# Patient Record
Sex: Female | Born: 1980 | Hispanic: No | Marital: Single | Smoking: Former smoker
Health system: Southern US, Community
[De-identification: ages and names within clinical notes are randomized; demographics above are authoritative.]

## PROBLEM LIST (undated history)

## (undated) ENCOUNTER — Inpatient Hospital Stay: Payer: Self-pay

## (undated) DIAGNOSIS — O09529 Supervision of elderly multigravida, unspecified trimester: Secondary | ICD-10-CM

## (undated) DIAGNOSIS — Z9889 Other specified postprocedural states: Secondary | ICD-10-CM

## (undated) DIAGNOSIS — R011 Cardiac murmur, unspecified: Secondary | ICD-10-CM

## (undated) DIAGNOSIS — R42 Dizziness and giddiness: Secondary | ICD-10-CM

## (undated) DIAGNOSIS — I10 Essential (primary) hypertension: Secondary | ICD-10-CM

## (undated) DIAGNOSIS — F53 Postpartum depression: Secondary | ICD-10-CM

## (undated) DIAGNOSIS — J189 Pneumonia, unspecified organism: Secondary | ICD-10-CM

## (undated) DIAGNOSIS — O99345 Other mental disorders complicating the puerperium: Secondary | ICD-10-CM

## (undated) DIAGNOSIS — M5136 Other intervertebral disc degeneration, lumbar region: Secondary | ICD-10-CM

## (undated) DIAGNOSIS — M51369 Other intervertebral disc degeneration, lumbar region without mention of lumbar back pain or lower extremity pain: Secondary | ICD-10-CM

## (undated) DIAGNOSIS — R112 Nausea with vomiting, unspecified: Secondary | ICD-10-CM

## (undated) HISTORY — PX: TONSILLECTOMY: SUR1361

## (undated) HISTORY — PX: WISDOM TOOTH EXTRACTION: SHX21

## (undated) HISTORY — PX: EYE SURGERY: SHX253

## (undated) HISTORY — PX: BACK SURGERY: SHX140

## (undated) HISTORY — PX: DILATION AND CURETTAGE OF UTERUS: SHX78

## (undated) HISTORY — PX: COLONOSCOPY: SHX174

---
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
## 2014-07-31 DIAGNOSIS — O09529 Supervision of elderly multigravida, unspecified trimester: Secondary | ICD-10-CM

## 2014-07-31 HISTORY — DX: Supervision of elderly multigravida, unspecified trimester: O09.529

## 2014-07-31 NOTE — L&D Delivery Note (Signed)
Delivery Note  34yo [id number] at 40+6wks At 9:18 AM a viable and healthy female "[place]" was delivered via Vaginal, Spontaneous Delivery (Presentation: Right Occiput Anterior).  APGAR: 9, 9; weight still pending skin to skin with mom  Placenta status: Intact, Spontaneous.  Cord: 3 vessels with no complications:   Anesthesia: Epidural  Episiotomy: None Lacerations: Labial Suture Repair: none Est. Blood Loss (mL): 300  Mom to postpartum.  Baby to Couplet care / Skin to Skin.  IOL with cervical ripening for gHTN with no s/s of severe preeclampsia, transitioned to pitocin, delivered spontaneously with 3 contractions. Vigorous and healthy baby promptly crying to mom's chest.  Johnie Stadel 06/29/2015, 9:33 AM

## 2014-12-07 ENCOUNTER — Other Ambulatory Visit: Payer: Self-pay | Admitting: Family Medicine

## 2014-12-07 DIAGNOSIS — F329 Major depressive disorder, single episode, unspecified: Secondary | ICD-10-CM | POA: Insufficient documentation

## 2014-12-07 DIAGNOSIS — Z3480 Encounter for supervision of other normal pregnancy, unspecified trimester: Secondary | ICD-10-CM

## 2014-12-07 DIAGNOSIS — Z3481 Encounter for supervision of other normal pregnancy, first trimester: Secondary | ICD-10-CM

## 2014-12-09 LAB — OB RESULTS CONSOLE ANTIBODY SCREEN: Antibody Screen: NEGATIVE

## 2014-12-09 LAB — OB RESULTS CONSOLE RPR: RPR: NONREACTIVE

## 2014-12-09 LAB — OB RESULTS CONSOLE HEPATITIS B SURFACE ANTIGEN: HEP B S AG: NEGATIVE

## 2014-12-09 LAB — OB RESULTS CONSOLE ABO/RH: RH TYPE: POSITIVE

## 2014-12-09 LAB — OB RESULTS CONSOLE HIV ANTIBODY (ROUTINE TESTING): HIV: NONREACTIVE

## 2014-12-09 LAB — OB RESULTS CONSOLE VARICELLA ZOSTER ANTIBODY, IGG: Varicella: IMMUNE

## 2014-12-21 ENCOUNTER — Other Ambulatory Visit: Payer: Self-pay | Admitting: Maternal & Fetal Medicine

## 2014-12-21 ENCOUNTER — Inpatient Hospital Stay
Admission: RE | Admit: 2014-12-21 | Discharge: 2014-12-21 | Disposition: A | Discharge status: Home / Self Care | Payer: Self-pay | Source: Ambulatory Visit

## 2014-12-21 ENCOUNTER — Ambulatory Visit
Admission: RE | Admit: 2014-12-21 | Discharge: 2014-12-21 | Disposition: A | Discharge status: Home / Self Care | Payer: Medicaid Other | Source: Ambulatory Visit | Attending: Maternal & Fetal Medicine | Admitting: Maternal & Fetal Medicine

## 2014-12-21 VITALS — BP 125/81 | HR 79 | Temp 98.2°F | Ht 72.0 in | Wt 209.0 lb

## 2014-12-21 DIAGNOSIS — O09529 Supervision of elderly multigravida, unspecified trimester: Secondary | ICD-10-CM | POA: Insufficient documentation

## 2014-12-21 DIAGNOSIS — O09522 Supervision of elderly multigravida, second trimester: Secondary | ICD-10-CM | POA: Insufficient documentation

## 2014-12-21 DIAGNOSIS — Z3A13 13 weeks gestation of pregnancy: Secondary | ICD-10-CM | POA: Insufficient documentation

## 2014-12-21 DIAGNOSIS — O09521 Supervision of elderly multigravida, first trimester: Secondary | ICD-10-CM

## 2014-12-21 DIAGNOSIS — Z3481 Encounter for supervision of other normal pregnancy, first trimester: Secondary | ICD-10-CM

## 2014-12-21 DIAGNOSIS — Z8279 Family history of other congenital malformations, deformations and chromosomal abnormalities: Secondary | ICD-10-CM

## 2014-12-21 LAB — US OB COMP LESS 14 WKS

## 2014-12-21 NOTE — Progress Notes (Signed)
Referring Provider:   [facility] Length of Consultation: 50 minutes  Jeanne Hamilton was referred to [facility] for genetic counseling because of advanced maternal age and a previous pregnancy loss diagnosed with trisomy 62.  The patient will be 34 years old at the time of delivery.  This note summarizes the information we discussed.    We first obtained a detailed family history and pregnancy history.  This is the sixth pregnancy for this couple, the fourth with this partner.  She has one 57 year old son from a previous relationship who is in good health.  Omon, the father of the baby, has two healthy sons as well.  The couple together has a three year old daughter who is also in good health.  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  We have requested medical records on that testing to confirm the type of chromosome difference.  She also mentioned that there were abnormal genetic test results in her pregnancy with their daughter, but that she was healthy at birth.  We have also requested those records. The remainder of the family history is unremarkable for birth defects, mental retardation, recurrent pregnancy loss or known chromosome abnormalities.  Jeanne Hamilton reported mental health conditions in several family members.  We discussed that mental health disorders are known to have strong genetic factors in many families, but that no specific genes are known at this time to determine who may be at risk.    Jeanne Hamilton reported no complications in this pregnancy and no exposures that would be expected to increase the risk for birth defects.  She was taking medications for back pain prior to learning that she was pregnant, but stopped them all as soon as she learned she was pregnant, reportedly at 4-[redacted] weeks gestation.  Exposures prior to 4-[redacted] weeks gestation are  considered to be in the all or none period, where they are either expected to result in early pregnancy loss or not cause harmful effects because the embryo has not yet implanted.  Chromosomes and examples of chromosome problems were reviewed.  Humans typically have 46 chromosomes in each cell, with half passed through each sperm and egg.  Any change in the number or structure of chromosomes can increase the risk of problems in the physical and mental development of a pregnancy. Chromosome differences are seen in approximately 50% of all first trimester spontaneous pregnancy losses.  The presence of an extra chromosome number 22 in each cell of an individual, known as trisomy 10, is not an uncommon chromosome difference.  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  If this is the case, then we would expect that the extra chromosome occurred sporadically as an error in the formation of either the egg or sperm for that pregnancy.  Rarely, the extra chromosome may be present attached to another chromosome, called a translocation, which may be inherited from a carrier parent.  This is much less likely, but we have requested medical records from the doctor in [place] where Jeanne Hamilton was seen during that pregnancy to confirm the karyotype.  If it was the usual trisomy, then the recurrence chance for any chromosome condition in a future pregnancy is estimated to be 1%, or the maternal age risk, which is similar at age 76 years.    We then explained  that the chance of a chromosome abnormality increases with maternal age.  Based upon age of the patient, the chance of any chromosome abnormality was 1 in 40. The chance of Down syndrome, the most common chromosome problem associated with maternal age, was 1 in 65.  The risk of chromosome problems is in addition to the 3% general population risk for birth defects and mental retardation.  The greatest chance, of course, is  that the baby would be born in good health.  We discussed the following prenatal screening and testing options for this pregnancy:  First trimester screening, which can include nuchal translucency ultrasound screen and/or first trimester maternal serum marker screening.  The nuchal translucency has approximately an 80% detection rate for Down syndrome and can be positive for other chromosome abnormalities as well as heart defects.  When combined with a maternal serum marker screening, the detection rate is up to 90% for Down syndrome and up to 97% for trisomy 18.     The chorionic villus sampling procedure is available for first trimester chromosome analysis.  This involves the withdrawal of a small amount of chorionic villi (tissue from the developing placenta).  Risk of pregnancy loss is estimated to be approximately 1 in 200 to 1 in 100 (0.5 to 1%).  There is approximately a 1% (1 in 100) chance that the CVS chromosome results will be unclear.  Chorionic villi cannot be tested for neural tube defects.     Maternal serum marker screening, a blood test that measures pregnancy proteins, can provide risk assessments for Down syndrome, trisomy 18, and open neural tube defects (spina bifida, anencephaly). Because it does not directly examine the fetus, it cannot positively diagnose or rule out these problems.  Targeted ultrasound uses high frequency sound waves to create an image of the developing fetus.  An ultrasound is often recommended as a routine means of evaluating the pregnancy.  It is also used to screen for fetal anatomy problems (for example, a heart defect) that might be suggestive of a chromosomal or other abnormality.   Amniocentesis involves the removal of a small amount of amniotic fluid from the sac surrounding the fetus with the use of a thin needle inserted through the maternal abdomen and uterus.  Ultrasound guidance is used throughout the procedure.  Fetal cells from amniotic fluid are  directly evaluated and > 99.5% of chromosome problems and > 98% of open neural tube defects can be detected. This procedure is generally performed after the 15th week of pregnancy.  The main risks to this procedure include complications leading to miscarriage in less than 1 in 200 cases (0.5%).  We also reviewed the availability of Non-invasive prenatal testing (NIPT) or cell free fetal DNA testing from maternal blood to determine whether or not the baby may have either Down syndrome, trisomy 45, or trisomy 6.  This test utilizes a maternal blood sample and DNA sequencing technology to isolate circulating cell free fetal DNA from maternal plasma.  The fetal DNA can then be analyzed for DNA sequences that are derived from the three most common chromosomes involved in aneuploidy, chromosomes 13, 18, and 21.  If the overall amount of DNA is greater than the expected level for any of these chromosomes, aneuploidy is suspected.  We explained that while we do not consider it a replacement for invasive testing and karyotype analysis, a negative result from this testing would be reassuring, though not a guarantee of a normal chromosome complement for the baby.  An abnormal result is certainly suggestive of an abnormal chromosome complement, though we would still recommend CVS or amniocentesis to confirm any findings from this testing.  Cystic Fibrosis screening was also discussed with the patient. Cystic fibrosis (CF) is one of the most common genetic conditions in persons of Caucasian ancestry.  This condition occurs in approximately 1 in 2,500 Caucasian persons and results in thickened secretions in the lungs, digestive, and reproductive systems.  For a baby to be at risk for having CF, both of the parents must be carriers for this condition.  Approximately 1 in 10625 Caucasian persons is a carrier for CF.  Current carrier testing looks for the most common mutations in the gene for CF and can detect approximately 90% of  carriers in the Caucasian population.  This means that the carrier screening can greatly reduce, but cannot eliminate, the chance for an individual to have a child with CF.  If an individual is found to be a carrier for CF, then carrier testing would be available for the partner. As part of [place]'s newborn screening profile, all babies born in the state of [place] will have a two-tier screening process.  Specimens are first tested to determine the concentration of immunoreactive trypsinogen (IRT).  The top 5% of specimens with the highest IRT values then undergo DNA testing using a panel of over 40 common CF mutations.   After consideration of the options, Jeanne Hamilton elected to proceed with an ultrasound and InformaSeq testing.  She declined CF carrier screening.  Results should be available in 2 weeks.  An ultrasound was performed at the time of the visit.  The gestational age was consistent with  13 weeks.  Fetal anatomy could not be assessed due to early gestational age.  Please refer to the ultrasound report for details of that study.  Jeanne Hamilton was encouraged to call with questions or concerns.  We can be contacted at [phone].    Jeanne Andersoneborah F. Brand Siever, Jeanne Hamilton, Jeanne Hamilton

## 2014-12-21 NOTE — Progress Notes (Signed)
Pt seen and evaluated by me, I agree with recommendations as outlined.

## 2014-12-27 LAB — INFORMASEQ(SM) WITH XY ANALYSIS
Fetal Fraction (%):: 7.7
Fetal Number: 1
Gestational Age at Collection: 13.7 weeks
Weight: 209 [lb_av]

## 2015-01-04 ENCOUNTER — Telehealth: Payer: Self-pay | Admitting: Obstetrics and Gynecology

## 2015-01-04 LAB — MISC LABCORP TEST (SEND OUT): LABCORP TEST CODE: 550716

## 2015-01-04 NOTE — Telephone Encounter (Signed)
The patient was informed of the results of her recent InformaSeq testing (performed at [facility]) which yielded NEGATIVE results.  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  The sensitivity for trisomy 3721, trisomy 1818 and trisomy 5913 using this testing are reported as 99.1%, 98.3% and 98.1% respectively.  Thus, while the results of this testing are highly accurate, they are not considered diagnostic at this time.  Should more definitive information be desired, the patient may still consider amniocentesis.   As requested to know by the patient, sex chromosome analysis was included for this sample.  Results was consistent with a female fetus (XY). This is predicted with >97% accuracy.  A maternal serum AFP only should be considered if screening for neural tube defects is desired.

## 2015-01-21 ENCOUNTER — Ambulatory Visit
Admission: RE | Admit: 2015-01-21 | Discharge: 2015-01-21 | Disposition: A | Discharge status: Home / Self Care | Payer: Medicaid Other | Source: Ambulatory Visit | Attending: Maternal & Fetal Medicine | Admitting: Maternal & Fetal Medicine

## 2015-01-21 DIAGNOSIS — Z3A18 18 weeks gestation of pregnancy: Secondary | ICD-10-CM | POA: Insufficient documentation

## 2015-01-21 DIAGNOSIS — O09529 Supervision of elderly multigravida, unspecified trimester: Secondary | ICD-10-CM

## 2015-01-21 HISTORY — DX: Supervision of elderly multigravida, unspecified trimester: O09.529

## 2015-01-21 LAB — US OB DETAIL + 14 WK

## 2015-01-21 NOTE — Progress Notes (Signed)
Patient seen, agree with plans as outlined in genetic counselor's note.  

## 2015-02-15 ENCOUNTER — Ambulatory Visit
Admission: RE | Admit: 2015-02-15 | Discharge: 2015-02-15 | Disposition: A | Discharge status: Home / Self Care | Payer: Medicaid Other | Source: Ambulatory Visit | Attending: Maternal & Fetal Medicine | Admitting: Maternal & Fetal Medicine

## 2015-02-15 DIAGNOSIS — O09522 Supervision of elderly multigravida, second trimester: Secondary | ICD-10-CM | POA: Insufficient documentation

## 2015-02-15 DIAGNOSIS — Z3A21 21 weeks gestation of pregnancy: Secondary | ICD-10-CM | POA: Diagnosis not present

## 2015-02-15 DIAGNOSIS — O09529 Supervision of elderly multigravida, unspecified trimester: Secondary | ICD-10-CM

## 2015-02-15 LAB — US OB FOLLOW UP

## 2015-05-28 LAB — OB RESULTS CONSOLE GBS: GBS: NEGATIVE

## 2015-06-22 ENCOUNTER — Inpatient Hospital Stay
Admission: EM | Admit: 2015-06-22 | Discharge: 2015-06-22 | Disposition: A | Discharge status: Home / Self Care | Payer: Medicaid Other | Attending: Obstetrics and Gynecology | Admitting: Obstetrics and Gynecology

## 2015-06-22 ENCOUNTER — Encounter: Payer: Self-pay | Admitting: *Deleted

## 2015-06-22 DIAGNOSIS — R030 Elevated blood-pressure reading, without diagnosis of hypertension: Secondary | ICD-10-CM | POA: Insufficient documentation

## 2015-06-22 DIAGNOSIS — O26893 Other specified pregnancy related conditions, third trimester: Secondary | ICD-10-CM | POA: Diagnosis not present

## 2015-06-22 DIAGNOSIS — Z3A39 39 weeks gestation of pregnancy: Secondary | ICD-10-CM | POA: Diagnosis not present

## 2015-06-22 LAB — URINALYSIS COMPLETE WITH MICROSCOPIC (ARMC ONLY)
Bilirubin Urine: NEGATIVE
Glucose, UA: NEGATIVE mg/dL
Hgb urine dipstick: NEGATIVE
KETONES UR: NEGATIVE mg/dL
LEUKOCYTES UA: NEGATIVE
NITRITE: NEGATIVE
PROTEIN: NEGATIVE mg/dL
SPECIFIC GRAVITY, URINE: 1.015 (ref 1.005–1.030)
pH: 7 (ref 5.0–8.0)

## 2015-06-22 LAB — CBC WITH DIFFERENTIAL/PLATELET
Basophils Absolute: 0 10*3/uL (ref 0–0.1)
Basophils Relative: 1 %
EOS ABS: 0 10*3/uL (ref 0–0.7)
Eosinophils Relative: 1 %
HCT: 32.7 % — ABNORMAL LOW (ref 35.0–47.0)
HEMOGLOBIN: 10.9 g/dL — AB (ref 12.0–16.0)
Lymphocytes Relative: 12 %
Lymphs Abs: 0.8 10*3/uL — ABNORMAL LOW (ref 1.0–3.6)
MCH: 27.1 pg (ref 26.0–34.0)
MCHC: 33.3 g/dL (ref 32.0–36.0)
MCV: 81.3 fL (ref 80.0–100.0)
Monocytes Absolute: 0.7 10*3/uL (ref 0.2–0.9)
Monocytes Relative: 11 %
NEUTROS PCT: 75 %
Neutro Abs: 4.9 10*3/uL (ref 1.4–6.5)
Platelets: 161 10*3/uL (ref 150–440)
RBC: 4.02 MIL/uL (ref 3.80–5.20)
RDW: 16.5 % — ABNORMAL HIGH (ref 11.5–14.5)
WBC: 6.4 10*3/uL (ref 3.6–11.0)

## 2015-06-22 LAB — COMPREHENSIVE METABOLIC PANEL
ALT: 12 U/L — AB (ref 14–54)
AST: 18 U/L (ref 15–41)
Albumin: 2.7 g/dL — ABNORMAL LOW (ref 3.5–5.0)
Alkaline Phosphatase: 111 U/L (ref 38–126)
Anion gap: 7 (ref 5–15)
BUN: 6 mg/dL (ref 6–20)
CO2: 23 mmol/L (ref 22–32)
Calcium: 8.4 mg/dL — ABNORMAL LOW (ref 8.9–10.3)
Chloride: 106 mmol/L (ref 101–111)
Creatinine, Ser: 0.65 mg/dL (ref 0.44–1.00)
GFR calc Af Amer: >60 mL/min (ref >60)
GFR calc non Af Amer: >60 mL/min (ref >60)
GLUCOSE: 86 mg/dL (ref 65–99)
Potassium: 3.6 mmol/L (ref 3.5–5.1)
SODIUM: 136 mmol/L (ref 135–145)
Total Bilirubin: 0.4 mg/dL (ref 0.3–1.2)
Total Protein: 6.4 g/dL — ABNORMAL LOW (ref 6.5–8.1)

## 2015-06-22 LAB — PROTEIN / CREATININE RATIO, URINE
Creatinine, Urine: 124 mg/dL
Protein Creatinine Ratio: 0.1 mg/mg{Cre} (ref 0.00–0.15)
TOTAL PROTEIN, URINE: 12 mg/dL

## 2015-06-22 LAB — URIC ACID: Uric Acid, Serum: 5.2 mg/dL (ref 2.3–6.6)

## 2015-06-22 MED ORDER — AZITHROMYCIN 250 MG PO TABS: ORAL_TABLET | ORAL | Status: DC

## 2015-06-22 MED ORDER — SALINE SPRAY 0.65 % NA SOLN: 1.0000 | NASAL | Status: DC | PRN

## 2015-06-22 NOTE — OB Triage Note (Signed)
Sent from office for elevated BP ?

## 2015-06-22 NOTE — OB Triage Provider Note (Signed)
History     CSN: [id number]  Arrival date and time: 06/22/15 1632   None    Jeanne Hamilton is a 34 yo G6P2 at 39+6 weeks by LMP of 08/30/14 with an EDD of 06/23/15 presenting today from [facility] office with elevated BPs.  Her blood pressure in the office was 140/90 and repeat was 152/100.  She denies headache, visual disturbances, epigastric pain, dysuria, vaginal bleeding, LOF, abnormal discharge. She reports sinus congestion and cough since Saturday without relief from Robitussin.  She denies fever and chills.  She reports good fetal movement.  Chief Complaint  Patient presents with  . Hypertension   Hypertension Pertinent negatives include no blurred vision, chest pain, headaches, palpitations or shortness of breath.    OB History    Gravida Para Term Preterm AB TAB SAB Ectopic Multiple Living   _0 Past Medical History  Diagnosis Date  . AMA (advanced maternal age) multigravida 35+ 2016    Past Surgical History  Procedure Laterality Date  . Dilation and curettage of uterus  2013 AND 2014  . Elbow arthrotomy Left 1991  . Tonsillectomy    . Ovarian cyst removal  1996    History reviewed. No pertinent family history.  Social History  Substance Use Topics  . Smoking status: Never Smoker   . Smokeless tobacco: Never Used  . Alcohol Use: No    Allergies:  Allergies  Allergen Reactions  . Tindamax [Tinidazole] Anaphylaxis and Rash  . Metrogel [Metronidazole] Rash    Prescriptions prior to admission  Medication Sig Dispense Refill Last Dose  . cephALEXin (KEFLEX) 250 MG capsule Take 250 mg by mouth 4 (four) times daily.   Taking  . Prenatal Vit-Fe Fumarate-FA (PRENATAL MULTIVITAMIN) TABS tablet Take 1 tablet by mouth daily at 12 noon.   Taking    Review of Systems  Constitutional: Negative for fever and chills.  HENT: Positive for congestion.        Sinus congestion   Eyes: Negative for blurred vision and photophobia.  Respiratory: Positive for  cough. Negative for sputum production, shortness of breath and wheezing.   Cardiovascular: Negative for chest pain and palpitations.  Gastrointestinal: Negative for heartburn, nausea, vomiting, abdominal pain and diarrhea.  Genitourinary: Negative for dysuria, urgency and frequency.  Neurological: Negative for headaches.   Physical Exam   Blood pressure 128/73, pulse 90, last menstrual period 08/30/2014.  Physical Exam  Constitutional: She is oriented to person, place, and time. She appears well-developed and well-nourished.  HENT:  Head: Normocephalic.  Cardiovascular: Normal rate and regular rhythm.   Respiratory: Breath sounds normal.  GI: Soft. Bowel sounds are normal. There is no tenderness. There is no rebound.  Genitourinary: Uterus normal.  Gravid, non-tender  Musculoskeletal: Normal range of motion.  Neurological: She is alert and oriented to person, place, and time. She has normal reflexes.  Skin: Skin is warm and dry.   Fetal monitoring: Baseline: 135 bpm / Moderate variability / +accels/ no decels Toco: irregular   Recent Results (from the past 2160 hour(s))  Protein / creatinine ratio, urine     Status: None   Collection Time: 06/22/15  6:01 PM  Result Value Ref Range   Creatinine, Urine 124 mg/dL   Total Protein, Urine 12 mg/dL    Comment: NO NORMAL RANGE ESTABLISHED FOR THIS TEST   Protein Creatinine Ratio 0.10 0.00 - 0.15 mg/mg[Cre]  Urinalysis complete, with microscopic ([facility] only)  Status: Abnormal   Collection Time: 06/22/15  6:01 PM  Result Value Ref Range   Color, Urine YELLOW (A) YELLOW   APPearance HAZY (A) CLEAR   Glucose, UA NEGATIVE NEGATIVE mg/dL   Bilirubin Urine NEGATIVE NEGATIVE   Ketones, ur NEGATIVE NEGATIVE mg/dL   Specific Gravity, Urine 1.015 1.005 - 1.030   Hgb urine dipstick NEGATIVE NEGATIVE   pH 7.0 5.0 - 8.0   Protein, ur NEGATIVE NEGATIVE mg/dL   Nitrite NEGATIVE NEGATIVE   Leukocytes, UA NEGATIVE NEGATIVE   RBC / HPF 0-5 0  - 5 RBC/hpf   WBC, UA 0-5 0 - 5 WBC/hpf   Bacteria, UA RARE (A) NONE SEEN   Squamous Epithelial / LPF 0-5 (A) NONE SEEN   Mucous PRESENT    Amorphous Crystal PRESENT   CBC with Differential/Platelet     Status: Abnormal   Collection Time: 06/22/15  6:03 PM  Result Value Ref Range   WBC 6.4 3.6 - 11.0 K/uL   RBC 4.02 3.80 - 5.20 MIL/uL   Hemoglobin 10.9 (L) 12.0 - 16.0 g/dL   HCT 32.7 (L) 35.0 - 47.0 %   MCV 81.3 80.0 - 100.0 fL   MCH 27.1 26.0 - 34.0 pg   MCHC 33.3 32.0 - 36.0 g/dL   RDW 16.5 (H) 11.5 - 14.5 %   Platelets 161 150 - 440 K/uL   Neutrophils Relative % 75 %   Neutro Abs 4.9 1.4 - 6.5 K/uL   Lymphocytes Relative 12 %   Lymphs Abs 0.8 (L) 1.0 - 3.6 K/uL   Monocytes Relative 11 %   Monocytes Absolute 0.7 0.2 - 0.9 K/uL   Eosinophils Relative 1 %   Eosinophils Absolute 0.0 0 - 0.7 K/uL   Basophils Relative 1 %   Basophils Absolute 0.0 0 - 0.1 K/uL  Comprehensive metabolic panel     Status: Abnormal   Collection Time: 06/22/15  6:03 PM  Result Value Ref Range   Sodium 136 135 - 145 mmol/L   Potassium 3.6 3.5 - 5.1 mmol/L   Chloride 106 101 - 111 mmol/L   CO2 23 22 - 32 mmol/L   Glucose, Bld 86 65 - 99 mg/dL   BUN 6 6 - 20 mg/dL   Creatinine, Ser 0.65 0.44 - 1.00 mg/dL   Calcium 8.4 (L) 8.9 - 10.3 mg/dL   Total Protein 6.4 (L) 6.5 - 8.1 g/dL   Albumin 2.7 (L) 3.5 - 5.0 g/dL   AST 18 15 - 41 U/L   ALT 12 (L) 14 - 54 U/L   Alkaline Phosphatase 111 38 - 126 U/L   Total Bilirubin 0.4 0.3 - 1.2 mg/dL   GFR calc non Af Amer >60 >60 mL/min   GFR calc Af Amer >60 >60 mL/min    Comment: (NOTE) The eGFR has been calculated using the CKD EPI equation. This calculation has not been validated in all clinical situations. eGFR's persistently <60 mL/min signify possible Chronic Kidney Disease.    Anion gap 7 5 - 15  Uric acid     Status: None   Collection Time: 06/22/15  6:03 PM  Result Value Ref Range   Uric Acid, Serum 5.2 2.3 - 6.6 mg/dL  I have reviewed all labs  and vital signs     Procedures    Assessment and Plan  IUP at 39+6 weeks R/O pre-eclampsia Sinusitis/ Upper Respiratory infection Continuous monitoring Pre-eclampsia labs BPs q15 min  06/21/14_0  IUP at 39+6 weeks BPs improving / occasional  elevated pressure with movement and after provider in the room Pre-eclampsia labs WNL  Category 1 Fetal tracing D/C home with strict pre-eclampsia precautions and 24 hour urine - turn in 24 hour urine to [facility] lab Labor precautions reviewed [facility]'s daily Rx for Z-pack sent with patient for sinusitis/URI/ may use saline nasal spray for sinus congestion / no sudafed d/t BPs Return to [facility] on Sunday 11/27 for NST and BP check, then on Monday 11/28 for AFI and Ob appointment - discussed options for IOL and patient elects to decline IOL until 41 weeks   Dr. Leafy Ro aware and agrees with plan of care   Darliss Cheney 06/22/2015, 9:09 PM

## 2015-06-22 NOTE — Discharge Instructions (Signed)
Discharge instructions given. 24hr urine container given and NST scheduled for Sunday 06/27/15 at 9am. No further questions or concerns

## 2015-06-24 LAB — LACTATE DEHYDROGENASE, ISOENZYMES
LDH 1: 21 % (ref 17–32)
LDH 2: 35 % (ref 25–40)
LDH 3: 24 % (ref 17–27)
LDH 4: 9 % (ref 5–13)
LDH 5: 11 % (ref 4–20)
LDH Isoenzymes, Total: 127 IU/L (ref 119–226)

## 2015-06-27 ENCOUNTER — Encounter: Payer: Self-pay | Admitting: *Deleted

## 2015-06-27 ENCOUNTER — Observation Stay
Admission: RE | Admit: 2015-06-27 | Discharge: 2015-06-27 | Disposition: A | Discharge status: Home / Self Care | Payer: Medicaid Other | Source: Home / Self Care | Attending: Obstetrics and Gynecology | Admitting: Obstetrics and Gynecology

## 2015-06-27 DIAGNOSIS — Z3689 Encounter for other specified antenatal screening: Secondary | ICD-10-CM

## 2015-06-27 DIAGNOSIS — Z3A40 40 weeks gestation of pregnancy: Secondary | ICD-10-CM

## 2015-06-27 DIAGNOSIS — O480 Post-term pregnancy: Secondary | ICD-10-CM

## 2015-06-27 NOTE — H&P (Signed)
Jeanne PandyLinda Hamilton is a 34 y.o. female presenting for IOL for postdates. EDC 06/23/15 . EGA 41+1 weeks . History OB History    Gravida Para Term Preterm AB TAB SAB Ectopic Multiple Living   6 2 2  3  3   2      Past Medical History  Diagnosis Date  . AMA (advanced maternal age) multigravida 35+ 2016   Past Surgical History  Procedure Laterality Date  . Elbow arthrotomy Left 1991  . Tonsillectomy    . Ovarian cyst removal  1996  . Dilation and curettage of uterus  2013 AND 2014   Family History: family history is not on file. Social History:  reports that she has never smoked. She has never used smokeless tobacco. She reports that she does not drink alcohol or use illicit drugs.   Prenatal Transfer Tool  Maternal Diabetes: No Genetic Screening: Normal Maternal Ultrasounds/Referrals: Normal Fetal Ultrasounds or other Referrals:  None Maternal Substance Abuse:  No Significant Maternal Medications:  None Significant Maternal Lab Results:  None Other Comments:  None  ROS    Blood pressure 109/77, pulse 86, temperature 98.2 F (36.8 C), temperature source Oral, resp. rate 16, height 5\' 10"  (1.778 m), weight 246 lb (111.585 kg), last menstrual period 08/30/2014. Exam Physical Exam  LUNGS CTA  CV RRR ABd gravid   Prenatal labs: ABO, Rh:  O+ Antibody:  neg Rubella:  Immune RPR:   Non reactive  HBsAg:   neg HIV:   neg GBS:   neg   Assessment/Plan: Induction of labor  41+1 weeks . AMA  Cervidil induction    Jeanne Hamilton 06/27/2015, 10:28 AM

## 2015-06-27 NOTE — Discharge Summary (Signed)
  Discharge summary  Dx : 40+4 weeks , AMA, Discharge : same  Procedure NST - reactive  Disposition : Home  Induction 07/01/15 in pm @ 2000

## 2015-06-27 NOTE — Progress Notes (Signed)
Patient ID: Jeanne PandyLinda Hamilton, female   DOB: 30-Apr-1981, 34 y.o.   MRN: [id number] Jeanne PandyLinda Hamilton 30-Apr-1981 G6 P2 [id number] presents for NST No LOF , no vaginal bleeding ,  Good fetal movt  O;BP 109/77 mmHg  Pulse 86  Temp(Src) 98.2 F (36.8 C) (Oral)  Resp 16  Ht 5\' 10"  (1.778 m)  Wt 246 lb (111.585 kg)  BMI 35.30 kg/m2  LMP 08/30/2014 (Within Weeks) ABDsoft  CX not checked  NST reactive  NST  Labs: none  A: 40+4 , reassuring NST  P:Induction posted 07/01/15  precautions until then

## 2015-06-27 NOTE — OB Triage Note (Signed)
Scheduled NST for postdates and BP recheck. Elaina HoopsElks, Tamkia Temples S

## 2015-06-28 ENCOUNTER — Inpatient Hospital Stay
Admission: EM | Admit: 2015-06-28 | Discharge: 2015-06-30 | DRG: 774 | Disposition: A | Discharge status: Home / Self Care | Payer: Medicaid Other | Attending: Obstetrics and Gynecology | Admitting: Obstetrics and Gynecology

## 2015-06-28 DIAGNOSIS — O480 Post-term pregnancy: Secondary | ICD-10-CM | POA: Diagnosis present

## 2015-06-28 DIAGNOSIS — N719 Inflammatory disease of uterus, unspecified: Secondary | ICD-10-CM | POA: Diagnosis not present

## 2015-06-28 DIAGNOSIS — Z3A40 40 weeks gestation of pregnancy: Secondary | ICD-10-CM

## 2015-06-28 DIAGNOSIS — O09523 Supervision of elderly multigravida, third trimester: Secondary | ICD-10-CM | POA: Diagnosis not present

## 2015-06-28 DIAGNOSIS — O8612 Endometritis following delivery: Secondary | ICD-10-CM | POA: Diagnosis not present

## 2015-06-28 DIAGNOSIS — O133 Gestational [pregnancy-induced] hypertension without significant proteinuria, third trimester: Secondary | ICD-10-CM | POA: Diagnosis present

## 2015-06-28 DIAGNOSIS — O134 Gestational [pregnancy-induced] hypertension without significant proteinuria, complicating childbirth: Secondary | ICD-10-CM | POA: Diagnosis present

## 2015-06-28 DIAGNOSIS — Z3A41 41 weeks gestation of pregnancy: Secondary | ICD-10-CM | POA: Diagnosis present

## 2015-06-28 LAB — CBC
HEMATOCRIT: 37.2 % (ref 35.0–47.0)
HEMOGLOBIN: 12 g/dL (ref 12.0–16.0)
MCH: 26.4 pg (ref 26.0–34.0)
MCHC: 32.4 g/dL (ref 32.0–36.0)
MCV: 81.5 fL (ref 80.0–100.0)
Platelets: 171 10*3/uL (ref 150–440)
RBC: 4.56 MIL/uL (ref 3.80–5.20)
RDW: 16.9 % — ABNORMAL HIGH (ref 11.5–14.5)
WBC: 7.9 10*3/uL (ref 3.6–11.0)

## 2015-06-28 LAB — TYPE AND SCREEN
ABO/RH(D): O POS
ANTIBODY SCREEN: NEGATIVE

## 2015-06-28 LAB — OB RESULTS CONSOLE ANTIBODY SCREEN: ANTIBODY SCREEN: NEGATIVE

## 2015-06-28 LAB — COMPREHENSIVE METABOLIC PANEL
ALBUMIN: 3.2 g/dL — AB (ref 3.5–5.0)
ALT: 13 U/L — ABNORMAL LOW (ref 14–54)
ANION GAP: 8 (ref 5–15)
AST: 20 U/L (ref 15–41)
Alkaline Phosphatase: 122 U/L (ref 38–126)
BILIRUBIN TOTAL: 0.4 mg/dL (ref 0.3–1.2)
BUN: 7 mg/dL (ref 6–20)
CO2: 25 mmol/L (ref 22–32)
Calcium: 9.9 mg/dL (ref 8.9–10.3)
Chloride: 103 mmol/L (ref 101–111)
Creatinine, Ser: 0.52 mg/dL (ref 0.44–1.00)
GFR calc Af Amer: >60 mL/min (ref >60)
GFR calc non Af Amer: >60 mL/min (ref >60)
GLUCOSE: 73 mg/dL (ref 65–99)
POTASSIUM: 3.8 mmol/L (ref 3.5–5.1)
Sodium: 136 mmol/L (ref 135–145)
TOTAL PROTEIN: 7.3 g/dL (ref 6.5–8.1)

## 2015-06-28 LAB — PROTEIN / CREATININE RATIO, URINE
Creatinine, Urine: 57 mg/dL
PROTEIN CREATININE RATIO: 0.12 mg/mg{creat} (ref 0.00–0.15)
TOTAL PROTEIN, URINE: 7 mg/dL

## 2015-06-28 LAB — OB RESULTS CONSOLE GC/CHLAMYDIA
CHLAMYDIA, DNA PROBE: NEGATIVE
Gonorrhea: NEGATIVE

## 2015-06-28 LAB — OB RESULTS CONSOLE ABO/RH: RH TYPE: POSITIVE

## 2015-06-28 LAB — ABO/RH: ABO/RH(D): O POS

## 2015-06-28 MED ORDER — ACETAMINOPHEN 325 MG PO TABS: 650.0000 mg | ORAL_TABLET | ORAL | Status: DC | PRN

## 2015-06-28 MED ORDER — PRENATAL MULTIVITAMIN CH
1.0000 | ORAL_TABLET | Freq: Every day | ORAL | Status: DC
  Filled 2015-06-28 (×2): qty 1

## 2015-06-28 MED ORDER — OXYTOCIN 40 UNITS IN LACTATED RINGERS INFUSION - SIMPLE MED
62.5000 mL/h | INTRAVENOUS | Status: DC
  Filled 2015-06-28: qty 1000

## 2015-06-28 MED ORDER — OXYCODONE-ACETAMINOPHEN 5-325 MG PO TABS: 1.0000 | ORAL_TABLET | ORAL | Status: DC | PRN

## 2015-06-28 MED ORDER — LIDOCAINE HCL (PF) 1 % IJ SOLN
30.0000 mL | INTRAMUSCULAR | Status: DC | PRN
  Filled 2015-06-28: qty 30

## 2015-06-28 MED ORDER — LACTATED RINGERS IV SOLN
INTRAVENOUS | Status: DC
  Administered 2015-06-28: 125 mL/h via INTRAVENOUS

## 2015-06-28 MED ORDER — TERBUTALINE SULFATE 1 MG/ML IJ SOLN: 0.2500 mg | Freq: Once | INTRAMUSCULAR | Status: DC | PRN

## 2015-06-28 MED ORDER — HYDRALAZINE HCL 20 MG/ML IJ SOLN: 10.0000 mg | Freq: Once | INTRAMUSCULAR | Status: DC | PRN

## 2015-06-28 MED ORDER — CITRIC ACID-SODIUM CITRATE 334-500 MG/5ML PO SOLN: 30.0000 mL | ORAL | Status: DC | PRN

## 2015-06-28 MED ORDER — BUTORPHANOL TARTRATE 1 MG/ML IJ SOLN: 1.0000 mg | INTRAMUSCULAR | Status: DC | PRN

## 2015-06-28 MED ORDER — LABETALOL HCL 5 MG/ML IV SOLN
20.0000 mg | INTRAVENOUS | Status: DC | PRN
Start: 2015-06-28 — End: 2015-06-30

## 2015-06-28 MED ORDER — PRENATAL MULTIVITAMIN CH: 1.0000 | ORAL_TABLET | Freq: Every day | ORAL | Status: DC

## 2015-06-28 MED ORDER — ZOLPIDEM TARTRATE 5 MG PO TABS: 5.0000 mg | ORAL_TABLET | Freq: Every evening | ORAL | Status: DC | PRN

## 2015-06-28 MED ORDER — OXYCODONE-ACETAMINOPHEN 5-325 MG PO TABS: 2.0000 | ORAL_TABLET | ORAL | Status: DC | PRN

## 2015-06-28 MED ORDER — DINOPROSTONE 10 MG VA INST
10.0000 mg | VAGINAL_INSERT | Freq: Once | VAGINAL | Status: DC
  Filled 2015-06-28: qty 1

## 2015-06-28 MED ORDER — MISOPROSTOL 25 MCG QUARTER TABLET
25.0000 ug | ORAL_TABLET | ORAL | Status: DC
  Administered 2015-06-28: 25 ug via ORAL
  Filled 2015-06-28: qty 1

## 2015-06-28 MED ORDER — CALCIUM CARBONATE ANTACID 500 MG PO CHEW: 2.0000 | CHEWABLE_TABLET | ORAL | Status: DC | PRN

## 2015-06-28 MED ORDER — LACTATED RINGERS IV SOLN: 500.0000 mL | INTRAVENOUS | Status: DC | PRN

## 2015-06-28 MED ORDER — OXYTOCIN 40 UNITS IN LACTATED RINGERS INFUSION - SIMPLE MED
1.0000 m[IU]/min | INTRAVENOUS | Status: DC
  Administered 2015-06-28: 1 m[IU]/min via INTRAVENOUS

## 2015-06-28 MED ORDER — ONDANSETRON HCL 4 MG/2ML IJ SOLN: 4.0000 mg | Freq: Four times a day (QID) | INTRAMUSCULAR | Status: DC | PRN

## 2015-06-28 MED ORDER — OXYTOCIN BOLUS FROM INFUSION
500.0000 mL | INTRAVENOUS | Status: DC
  Administered 2015-06-29: 500 mL via INTRAVENOUS

## 2015-06-28 MED ORDER — DOCUSATE SODIUM 100 MG PO CAPS: 100.0000 mg | ORAL_CAPSULE | Freq: Every day | ORAL | Status: DC

## 2015-06-28 NOTE — H&P (Signed)
Jeanne PandyLinda Hamilton is a 34 y.o. female presenting from the office with elevated BP. Serial BP in triage confirm hypertension. PreE labs normal. Good fetal movement, no LOF, no VB.  34 y.o. [id number] at 40+5wks by early [facility] perinatology ultrasound    Factors complicating this pregnancy   AMA: Informaseq negative, anatomy scan normal  Screening results and needs:  NOB:   MBT O positive Ab screen negative Pap negative HIV negative Hep B/RPR negative/negative  Rubella immune VZV immune  Aneuploidy:   First trimester (Informaseq, NT): negative Second trimester (AFP/tetra): negative  28 weeks:   Blood consent:04/06/15  Hgb: 10.2 Glucola: 128 Rhogam: NA  36 weeks:   GBS: Negative G/C: Negative Hgb: 10.5  Last US: 12/21/14-[facility]-post plac, AFI-wnl, normal anatomy  Immunization:   Flu in season - 04/29/15  Tdap at 27-36 weeks - 04/06/2015  Social: no changes  Contraception Plan: BTL- MA-31 form signed at first visit  Feeding Plan: breast and bottle Maternal Medical History:  Reason for admission: Nausea.    OB History    Gravida Para Term Preterm AB TAB SAB Ectopic Multiple Living   6 2 2  3  3   2      Past Medical History  Diagnosis Date  . AMA (advanced maternal age) multigravida 35+ 2016   Past Surgical History  Procedure Laterality Date  . Elbow arthrotomy Left 1991  . Tonsillectomy    . Ovarian cyst removal  1996  . Dilation and curettage of uterus  2013 AND 2014   Family History: family history is not on file. Social History:  reports that she has never smoked. She has never used smokeless tobacco. She reports that she does not drink alcohol or use illicit drugs.   Prenatal Transfer Tool  Maternal Diabetes: No Genetic Screening: Normal Maternal Ultrasounds/Referrals: Normal Fetal Ultrasounds or other Referrals:  None Maternal Substance Abuse:  No Significant Maternal Medications:  None Significant Maternal Lab Results:  None Other Comments:   None  Review of Systems  Constitutional: Negative for fever and chills.  Eyes: Negative for blurred vision and double vision.       Spots in vision last night, not presently  Respiratory: Negative for shortness of breath.   Cardiovascular: Negative for chest pain and palpitations.  Gastrointestinal: Negative for nausea, vomiting, abdominal pain, diarrhea and constipation.  Genitourinary: Negative for dysuria, urgency, frequency and flank pain.  Neurological: Negative for headaches.       Headache that comes and goes, relieved by Tylenol  Psychiatric/Behavioral: Negative for depression.    Dilation: 2 Effacement (%): 60 Station:  (high) Exam by:: J coble,RN Blood pressure 136/78, pulse 91, temperature 97.7 F (36.5 C), temperature source Oral, resp. rate 18, height 5\' 11"  (1.803 m), weight 244 lb (110.678 kg), last menstrual period 08/30/2014. Exam Physical Exam  Constitutional: She is oriented to person, place, and time. She appears well-developed and well-nourished. No distress.  Eyes: No scleral icterus.  Neck: Normal range of motion. Neck supple.  Cardiovascular: Normal rate.   Respiratory: Effort normal. No respiratory distress.  GI: Soft. She exhibits no distension. There is no tenderness.  Genitourinary: Vagina normal and uterus normal.  Musculoskeletal: Normal range of motion.  Neurological: She is alert and oriented to person, place, and time.  Skin: Skin is warm and dry.  Psychiatric: She has a normal mood and affect.    Prenatal labs: ABO, Rh: --/--/O POS (11/28 1420) Antibody: NEG (11/28 1420) Rubella:   RPR: Nonreactive (05/11 0000)  HBsAg: Negative (05/11 0000)  HIV: Non-reactive (05/11 0000)  GBS: Negative (10/28 1043)   Assessment/Plan: 1. Admission for induction for gHTN, as no severe features but mild range BP. One severe systolic in triage, while patient lying on cuff. Will add mag for seizure ppx if clinical situation changes. 2. Cervical ripening with  cytotec 3. Pain meds prn; may have epidural when desired. 4. Continuous fetal monitoring 5. Serial blood pressures  Jeanne Hamilton 06/28/2015, 6:38 PM

## 2015-06-28 NOTE — OB Triage Note (Signed)
[facility].  Complaining of a headache and some spotty vision, denies any RUQ pain.

## 2015-06-29 ENCOUNTER — Encounter: Payer: Self-pay | Admitting: Anesthesiology

## 2015-06-29 ENCOUNTER — Inpatient Hospital Stay: Payer: Medicaid Other | Admitting: Anesthesiology

## 2015-06-29 LAB — RUBELLA SCREEN: RUBELLA: 2.41 {index} (ref >0.99)

## 2015-06-29 LAB — RPR: RPR: NONREACTIVE

## 2015-06-29 MED ORDER — BENZOCAINE-MENTHOL 20-0.5 % EX AERO
1.0000 | INHALATION_SPRAY | CUTANEOUS | Status: DC | PRN
Start: 2015-06-29 — End: 2015-06-30

## 2015-06-29 MED ORDER — SENNOSIDES-DOCUSATE SODIUM 8.6-50 MG PO TABS
2.0000 | ORAL_TABLET | ORAL | Status: DC
  Administered 2015-06-30: 2 via ORAL
  Filled 2015-06-29: qty 2

## 2015-06-29 MED ORDER — ZOLPIDEM TARTRATE 5 MG PO TABS: 5.0000 mg | ORAL_TABLET | Freq: Every evening | ORAL | Status: DC | PRN

## 2015-06-29 MED ORDER — SODIUM CHLORIDE 0.9 % IJ SOLN
INTRAMUSCULAR | Status: AC
  Filled 2015-06-29: qty 6

## 2015-06-29 MED ORDER — SODIUM CHLORIDE 0.9 % IJ SOLN: 3.0000 mL | INTRAMUSCULAR | Status: DC | PRN

## 2015-06-29 MED ORDER — DIBUCAINE 1 % RE OINT: 1.0000 "application " | TOPICAL_OINTMENT | RECTAL | Status: DC | PRN

## 2015-06-29 MED ORDER — SIMETHICONE 80 MG PO CHEW: 80.0000 mg | CHEWABLE_TABLET | ORAL | Status: DC | PRN

## 2015-06-29 MED ORDER — FENTANYL 2.5 MCG/ML W/ROPIVACAINE 0.2% IN NS 100 ML EPIDURAL INFUSION (ARMC-ANES)
EPIDURAL | Status: AC
  Administered 2015-06-29: 10 mL/h via EPIDURAL
  Filled 2015-06-29: qty 100

## 2015-06-29 MED ORDER — LIDOCAINE HCL (PF) 1 % IJ SOLN
INTRAMUSCULAR | Status: AC
  Filled 2015-06-29: qty 30

## 2015-06-29 MED ORDER — EPHEDRINE 5 MG/ML INJ
10.0000 mg | INTRAVENOUS | Status: DC | PRN
  Filled 2015-06-29: qty 2

## 2015-06-29 MED ORDER — ONDANSETRON HCL 4 MG PO TABS: 4.0000 mg | ORAL_TABLET | ORAL | Status: DC | PRN

## 2015-06-29 MED ORDER — OXYTOCIN 10 UNIT/ML IJ SOLN
INTRAMUSCULAR | Status: AC
  Filled 2015-06-29: qty 2

## 2015-06-29 MED ORDER — MISOPROSTOL 200 MCG PO TABS
ORAL_TABLET | ORAL | Status: AC
  Filled 2015-06-29: qty 4

## 2015-06-29 MED ORDER — MEASLES, MUMPS & RUBELLA VAC ~~LOC~~ INJ: 0.5000 mL | INJECTION | Freq: Once | SUBCUTANEOUS | Status: DC

## 2015-06-29 MED ORDER — PRENATAL MULTIVITAMIN CH
1.0000 | ORAL_TABLET | Freq: Every day | ORAL | Status: DC
  Administered 2015-06-29 – 2015-06-30 (×2): 1 via ORAL
  Filled 2015-06-29 (×2): qty 1

## 2015-06-29 MED ORDER — WITCH HAZEL-GLYCERIN EX PADS: 1.0000 "application " | MEDICATED_PAD | CUTANEOUS | Status: DC | PRN

## 2015-06-29 MED ORDER — ONDANSETRON HCL 4 MG/2ML IJ SOLN: 4.0000 mg | INTRAMUSCULAR | Status: DC | PRN

## 2015-06-29 MED ORDER — OXYCODONE-ACETAMINOPHEN 5-325 MG PO TABS
1.0000 | ORAL_TABLET | ORAL | Status: DC | PRN
  Administered 2015-06-29 – 2015-06-30 (×2): 1 via ORAL
  Filled 2015-06-29 (×3): qty 1

## 2015-06-29 MED ORDER — ACETAMINOPHEN 325 MG PO TABS: 650.0000 mg | ORAL_TABLET | ORAL | Status: DC | PRN

## 2015-06-29 MED ORDER — LIDOCAINE-EPINEPHRINE (PF) 1.5 %-1:200000 IJ SOLN
INTRAMUSCULAR | Status: DC | PRN
  Administered 2015-06-29: 3 mL via PERINEURAL

## 2015-06-29 MED ORDER — LANOLIN HYDROUS EX OINT
TOPICAL_OINTMENT | CUTANEOUS | Status: DC | PRN
Start: 2015-06-29 — End: 2015-06-30

## 2015-06-29 MED ORDER — DIPHENHYDRAMINE HCL 25 MG PO CAPS: 25.0000 mg | ORAL_CAPSULE | Freq: Four times a day (QID) | ORAL | Status: DC | PRN

## 2015-06-29 MED ORDER — BISACODYL 10 MG RE SUPP: 10.0000 mg | Freq: Every day | RECTAL | Status: DC | PRN

## 2015-06-29 MED ORDER — CEFAZOLIN SODIUM-DEXTROSE 2-3 GM-% IV SOLR: 2.0000 g | Freq: Once | INTRAVENOUS | Status: DC

## 2015-06-29 MED ORDER — IBUPROFEN 600 MG PO TABS
600.0000 mg | ORAL_TABLET | Freq: Four times a day (QID) | ORAL | Status: DC
  Administered 2015-06-29 – 2015-06-30 (×5): 600 mg via ORAL
  Filled 2015-06-29 (×5): qty 1

## 2015-06-29 MED ORDER — SODIUM CHLORIDE 0.9 % IJ SOLN: 3.0000 mL | Freq: Two times a day (BID) | INTRAMUSCULAR | Status: DC

## 2015-06-29 MED ORDER — OXYTOCIN 40 UNITS IN LACTATED RINGERS INFUSION - SIMPLE MED: 62.5000 mL/h | INTRAVENOUS | Status: DC | PRN

## 2015-06-29 MED ORDER — FENTANYL 2.5 MCG/ML W/ROPIVACAINE 0.2% IN NS 100 ML EPIDURAL INFUSION (ARMC-ANES): 10.0000 mL/h | EPIDURAL | Status: DC

## 2015-06-29 MED ORDER — AMMONIA AROMATIC IN INHA
RESPIRATORY_TRACT | Status: AC
  Filled 2015-06-29: qty 10

## 2015-06-29 MED ORDER — PHENYLEPHRINE 40 MCG/ML (10ML) SYRINGE FOR IV PUSH (FOR BLOOD PRESSURE SUPPORT)
80.0000 ug | PREFILLED_SYRINGE | INTRAVENOUS | Status: DC | PRN
  Filled 2015-06-29: qty 2

## 2015-06-29 MED ORDER — DIPHENHYDRAMINE HCL 50 MG/ML IJ SOLN
12.5000 mg | INTRAMUSCULAR | Status: DC | PRN
  Administered 2015-06-29: 12.5 mg via INTRAVENOUS
  Filled 2015-06-29: qty 1

## 2015-06-29 MED ORDER — BUPIVACAINE HCL (PF) 0.25 % IJ SOLN
INTRAMUSCULAR | Status: DC | PRN
  Administered 2015-06-29: 10 mL via EPIDURAL

## 2015-06-29 MED ORDER — OXYCODONE-ACETAMINOPHEN 5-325 MG PO TABS
2.0000 | ORAL_TABLET | ORAL | Status: DC | PRN
  Administered 2015-06-29 – 2015-06-30 (×4): 2 via ORAL
  Filled 2015-06-29 (×3): qty 2
  Filled 2015-06-29 (×2): qty 1

## 2015-06-29 MED ORDER — TETANUS-DIPHTH-ACELL PERTUSSIS 5-2.5-18.5 LF-MCG/0.5 IM SUSP: 0.5000 mL | Freq: Once | INTRAMUSCULAR | Status: DC

## 2015-06-29 MED ORDER — FLEET ENEMA 7-19 GM/118ML RE ENEM: 1.0000 | ENEMA | Freq: Every day | RECTAL | Status: DC | PRN

## 2015-06-29 MED ORDER — SODIUM CHLORIDE 0.9 % IV SOLN: 250.0000 mL | INTRAVENOUS | Status: DC | PRN

## 2015-06-29 NOTE — Progress Notes (Signed)
   06/29/15 0735  Cervical Exam  Vag. Bleeding Small (trickling dark red bleeding noted, blue chux changed, peri )

## 2015-06-29 NOTE — Anesthesia Preprocedure Evaluation (Signed)
Anesthesia Evaluation  Patient identified by MRN, date of birth, ID band Patient awake    Reviewed: Allergy & Precautions, NPO status , Patient's Chart, lab work & pertinent test results  Airway Mallampati: II  TM Distance: >3 FB Neck ROM: Full    Dental no notable dental hx.    Pulmonary neg pulmonary ROS,    Pulmonary exam normal breath sounds clear to auscultation       Cardiovascular negative cardio ROS Normal cardiovascular exam     Neuro/Psych negative neurological ROS     GI/Hepatic negative GI ROS, Neg liver ROS,   Endo/Other  negative endocrine ROS  Renal/GU negative Renal ROS  negative genitourinary   Musculoskeletal negative musculoskeletal ROS (+)   Abdominal Normal abdominal exam  (+)   Peds negative pediatric ROS (+)  Hematology negative hematology ROS (+)   Anesthesia Other Findings   Reproductive/Obstetrics (+) Pregnancy                             Anesthesia Physical Anesthesia Plan  ASA: II  Anesthesia Plan: Epidural   Post-op Pain Management:    Induction:   Airway Management Planned: Natural Airway  Additional Equipment:   Intra-op Plan:   Post-operative Plan:   Informed Consent: I have reviewed the patients History and Physical, chart, labs and discussed the procedure including the risks, benefits and alternatives for the proposed anesthesia with the patient or authorized representative who has indicated his/her understanding and acceptance.   Dental advisory given  Plan Discussed with: CRNA and Surgeon  Anesthesia Plan Comments:         Anesthesia Quick Evaluation

## 2015-06-29 NOTE — Progress Notes (Addendum)
Trickling small amt. dark red bleeding noted, clean pad and peri care provided.  FHR 140s.  Toco adjusted - monitor not picking up contractions. Pitocin at 10 mu. RN will continue to monitor. Foley catheter with 50 ml of yellow urine. IV infusing at 150 ml/hr, Epidural in place with continuous infusion.

## 2015-06-29 NOTE — Anesthesia Procedure Notes (Signed)
Epidural  Start time: 06/29/2015 4:12 AM End time: 06/29/2015 4:20 AM  Staffing Anesthesiologist: Yves DillARROLL, Brelan Hannen Performed by: anesthesiologist   Preanesthetic Checklist Completed: patient identified, site marked, surgical consent, pre-op evaluation, timeout performed, IV checked, risks and benefits discussed and monitors and equipment checked  Epidural Patient position: sitting Prep: Betadine and site prepped and draped Patient monitoring: heart rate, cardiac monitor, continuous pulse ox and blood pressure Approach: midline Location: L3-L4 Injection technique: LOR air  Needle:  Needle type: Tuohy  Needle gauge: 18 G Needle length: 9 cm Catheter type: closed end Catheter size: 20 Guage Test dose: negative and 1.5% lidocaine with Epi 1:200 K  Assessment Sensory level: T8  Additional Notes Time out called.  Patient placed in sitting position.  Prepped and draped in sterile fashion.  A skin wheal was made with 1% lidocaine plain.  An 6018 G Tuohy needle was advanced to the epidural space by loss of resistance technique.  Epidural catheter was threaded 3cm with negative TD

## 2015-06-29 NOTE — Progress Notes (Signed)
Pt. Voided small amount of red tinge urine. Pericare given, steady gait to BR. Pt[facility]

## 2015-06-29 NOTE — Progress Notes (Signed)
OB ATTENDING NOTE: IOL Day #2  S: pt sleeping comfortably after epidural  Labor events: S/p cytotec, now on pitocin S/p epidural  O: Filed Vitals:   06/29/15 0525 06/29/15 0530 06/29/15 0535 06/29/15 0540  BP: 107/55     Pulse: 77     Temp:      TempSrc:      Resp:      Height:      Weight:      SpO2: 99% 99% 99% 99%    GEN: NAD ABD: gravid, NT EXT: wwp  SVE: per RN, last 3/70/-3  EFM: 150 mod var, +Accels, no decels Toco: Q303min  Labs: CMP Latest Ref Rng 06/28/2015 06/22/2015  Glucose 65 - 99 mg/dL 73 86  BUN 6 - 20 mg/dL 7 6  Creatinine 4.090.44 - 1.00 mg/dL 8.110.52 9.140.65  Sodium 782135 - 145 mmol/L 136 136  Potassium 3.5 - 5.1 mmol/L 3.8 3.6  Chloride 101 - 111 mmol/L 103 106  CO2 22 - 32 mmol/L 25 23  Calcium 8.9 - 10.3 mg/dL 9.9 9.5(A8.4(L)  Total Protein 6.5 - 8.1 g/dL 7.3 6.4(L)  Total Bilirubin 0.3 - 1.2 mg/dL 0.4 0.4  Alkaline Phos 38 - 126 U/L 122 111  AST 15 - 41 U/L 20 18  ALT 14 - 54 U/L 13(L) 12(L)    CBC Latest Ref Rng 06/28/2015 06/22/2015  WBC 3.6 - 11.0 K/uL 7.9 6.4  Hemoglobin 12.0 - 16.0 g/dL 21.312.0 10.9(L)  Hematocrit 35.0 - 47.0 % 37.2 32.7(L)  Platelets 150 - 440 K/uL 171 161    A/P: 34 y.o. [id number] at 40+5wks by early [facility] perinatology ultrasound here for IOL for gHTN. Normal PEC labs, mostly normal BPs overnight.  - cont pitocin, recheck 4 hours, likely AROM at that time - cont epidural - cont EFM/toco - monitor BPs, no need for mag at this time - if BP's become severe range, will recheck PEC labs  Ala DachJohanna K Harry Bark, MD

## 2015-06-29 NOTE — Progress Notes (Signed)
Epidural catheter removed with blue tip in tact. RN assist pt. To BR for post delivery void. Pericare provided.

## 2015-06-30 LAB — CBC
HCT: 31.8 % — ABNORMAL LOW (ref 35.0–47.0)
HEMOGLOBIN: 10.4 g/dL — AB (ref 12.0–16.0)
MCH: 26.8 pg (ref 26.0–34.0)
MCHC: 32.6 g/dL (ref 32.0–36.0)
MCV: 82.1 fL (ref 80.0–100.0)
Platelets: 122 10*3/uL — ABNORMAL LOW (ref 150–440)
RBC: 3.88 MIL/uL (ref 3.80–5.20)
RDW: 16.7 % — AB (ref 11.5–14.5)
WBC: 6.8 10*3/uL (ref 3.6–11.0)

## 2015-06-30 MED ORDER — IBUPROFEN 600 MG PO TABS: 600.0000 mg | ORAL_TABLET | Freq: Four times a day (QID) | ORAL | Status: DC

## 2015-06-30 MED ORDER — AMOXICILLIN-POT CLAVULANATE 875-125 MG PO TABS: 1.0000 | ORAL_TABLET | Freq: Two times a day (BID) | ORAL | Status: DC

## 2015-06-30 MED ORDER — AMOXICILLIN-POT CLAVULANATE 875-125 MG PO TABS
1.0000 | ORAL_TABLET | Freq: Two times a day (BID) | ORAL | Status: DC
Start: 2015-06-30 — End: 2015-06-30
  Administered 2015-06-30: 1 via ORAL
  Filled 2015-06-30: qty 1

## 2015-06-30 MED ORDER — OXYCODONE-ACETAMINOPHEN 5-325 MG PO TABS: 1.0000 | ORAL_TABLET | ORAL | Status: DC | PRN

## 2015-06-30 NOTE — Discharge Instructions (Signed)
If you develop a headache that doesn't go away with tylenol, pain in your right upper belly, spots in your vision, a fever or any other concerns, please call the office.  Care After Vaginal Delivery Congratulations on your new baby!!  Refer to this sheet in the next few weeks. These discharge instructions provide you with information on caring for yourself after delivery. Your caregiver may also give you specific instructions. Your treatment has been planned according to the most current medical practices available, but problems sometimes occur. Call your caregiver if you have any problems or questions after you go home.  HOME CARE INSTRUCTIONS  Take over-the-counter or prescription medicines only as directed by your caregiver or pharmacist.  Do not drink alcohol, especially if you are breastfeeding or taking medicine to relieve pain.  Do not chew or smoke tobacco.  Do not use illegal drugs.  Continue to use good perineal care. Good perineal care includes:  Wiping your perineum from front to back.  Keeping your perineum clean.  Do not use tampons or douche until your caregiver says it is okay.  Shower, wash your hair, and take tub baths as directed by your caregiver.  Wear a well-fitting bra that provides breast support.  Eat healthy foods.  Drink enough fluids to keep your urine clear or pale yellow.  Eat high-fiber foods such as whole grain cereals and breads, brown rice, beans, and fresh fruits and vegetables every day. These foods may help prevent or relieve constipation.  Follow your caregiver's recommendations regarding resumption of activities such as climbing stairs, driving, lifting, exercising, or traveling. Specifically, no driving for two weeks, so that you are comfortable reacting quickly in an emergency.  Talk to your caregiver about resuming sexual activities. Resumption of sexual activities is dependent upon your risk of infection, your rate of healing, and your  comfort and desire to resume sexual activity. Usually we recommend waiting about six weeks, or until your bleeding stops and you are interested in sex.  Try to have someone help you with your household activities and your newborn for at least a few days after you leave the hospital. Even longer is better.  Rest as much as possible. Try to rest or take a nap when your newborn is sleeping. Sleep deprivation can be very hard after delivery.  Increase your activities gradually.  Keep all of your scheduled postpartum appointments. It is very important to keep your scheduled follow-up appointments. At these appointments, your caregiver will be checking to make sure that you are healing physically and emotionally.  SEEK MEDICAL CARE IF:   You are passing large clots from your vagina.   You have a foul smelling discharge from your vagina.  You have trouble urinating.  You are urinating frequently.  You have pain when you urinate.  You have a change in your bowel movements.  You have increasing redness, pain, or swelling near your vaginal incision (episiotomy) or vaginal tear.  You have pus draining from your episiotomy or vaginal tear.  Your episiotomy or vaginal tear is separating.  You have painful, hard, or reddened breasts.  You have a severe headache.  You have blurred vision or see spots.  You feel sad or depressed.  You have thoughts of hurting yourself or your newborn.  You have questions about your care, the care of your newborn, or medicines.  You are dizzy or light-headed.  You have a rash.  You have nausea or vomiting.  You were breastfeeding and have  not had a menstrual period within 12 weeks after you stopped breastfeeding.  You are not breastfeeding and have not had a menstrual period by the 12th week after delivery.  You have a fever.  SEEK IMMEDIATE MEDICAL CARE IF:   You have persistent pain.  You have chest pain.  You have shortness of  breath.  You faint.  You have leg pain.  You have stomach pain.  Your vaginal bleeding saturates two or more sanitary pads in 1 hour.  MAKE SURE YOU:   Understand these instructions.  Will get help right away if you are not doing well or get worse.   Document Released: 07/14/2000 Document Revised: 12/01/2013 Document Reviewed: 03/13/2012  [facility] Patient Information 2015 [place], [place]. This information is not intended to replace advice given to you by your health care provider. Make sure you discuss any questions you have with your health care provider.

## 2015-06-30 NOTE — Progress Notes (Signed)
Discharge instructions reviewed with patient. Pt v/u of all instructions. ID bands of mom and infant matched. Prescription given to pt. Escorted by nursing via w/c in stable condition, infant in arms. Ruta HindsKelly Rosamae Rocque, RN 06/30/15 [phone]

## 2015-06-30 NOTE — Discharge Summary (Signed)
Obstetric Discharge Summary Reason for Admission: induction of labor for gHTN Prenatal Procedures: NST and ultrasound Intrapartum Procedures: spontaneous vaginal delivery Postpartum Procedures: none Complications-Operative and Postpartum: none ; suspicious of endometritis based on clinical exam HEMOGLOBIN  Date Value Ref Range Status  06/30/2015 10.4* 12.0 - 16.0 g/dL Final   HCT  Date Value Ref Range Status  06/30/2015 31.8* 35.0 - 47.0 % Final    Physical Exam:  General: alert, cooperative, fatigued and no distress Lochia: appropriate Uterine Fundus: firm and tender DVT Evaluation: No evidence of DVT seen on physical exam.  Discharge Diagnoses: Term Pregnancy-delivered; endometritis  Discharge Information: Date: 06/30/2015 Activity: pelvic rest Diet: routine Medications: Ibuprofen, Colace and Percocet Condition: stable Instructions: refer to practice specific booklet Discharge to: home Follow-up Information    Follow up with Christeen DouglasBEASLEY, Micaiah Litle, MD In 1 week.   Specialty:  Obstetrics and Gynecology   Why:  blood pressure check   Contact information:   [address] [place] [id number] [phone]      D/C home with augmentin  Newborn Data: Live born female  Birth Weight: 7 lb 15 oz (3600 g) APGAR: 9, 9  Home with mother.  Christeen DouglasBEASLEY, Tawanna Funk 06/30/2015, 11:50 AM

## 2015-06-30 NOTE — Anesthesia Postprocedure Evaluation (Signed)
Anesthesia Post Note  Patient: Jeanne Hamilton  Procedure(s) Performed: * No procedures listed *  Patient location during evaluation: Mother Baby Anesthesia Type: Epidural Level of consciousness: awake Pain management: pain level controlled Vital Signs Assessment: post-procedure vital signs reviewed and stable Respiratory status: spontaneous breathing Cardiovascular status: stable Postop Assessment: no headache and no signs of nausea or vomiting Anesthetic complications: no    Last Vitals:  Filed Vitals:   06/30/15 0744 06/30/15 1219  BP: 144/67 126/75  Pulse: 68 85  Temp: 36.7 C 37.1 C  Resp: 18 20    Last Pain:  Filed Vitals:   06/30/15 1418  PainSc: 2                  [name],  [name]

## 2015-06-30 NOTE — Anesthesia Postprocedure Evaluation (Deleted)
Anesthesia Post Note  Patient: Jeanne PandyLinda Moser  Procedure(s) Performed: * No procedures listed *  Anesthesia Post Evaluation  Last Vitals:  Filed Vitals:   06/30/15 0744 06/30/15 1219  BP: 144/67 126/75  Pulse: 68 85  Temp: 36.7 C 37.1 C  Resp: 18 20    Last Pain:  Filed Vitals:   06/30/15 1418  PainSc: 2                  Zehra Rucci,  Cordarius Benning R

## 2015-09-08 DIAGNOSIS — I10 Essential (primary) hypertension: Secondary | ICD-10-CM | POA: Insufficient documentation

## 2015-10-04 DIAGNOSIS — M545 Low back pain, unspecified: Secondary | ICD-10-CM | POA: Insufficient documentation

## 2015-10-04 DIAGNOSIS — M51369 Other intervertebral disc degeneration, lumbar region without mention of lumbar back pain or lower extremity pain: Secondary | ICD-10-CM | POA: Diagnosis present

## 2015-10-07 ENCOUNTER — Other Ambulatory Visit: Payer: Self-pay | Admitting: Neurosurgery

## 2015-10-07 DIAGNOSIS — M5137 Other intervertebral disc degeneration, lumbosacral region: Secondary | ICD-10-CM

## 2015-10-27 ENCOUNTER — Ambulatory Visit
Admission: RE | Admit: 2015-10-27 | Discharge: 2015-10-27 | Disposition: A | Discharge status: Home / Self Care | Payer: Medicaid Other | Source: Ambulatory Visit | Attending: Neurosurgery | Admitting: Neurosurgery

## 2015-10-27 DIAGNOSIS — M5137 Other intervertebral disc degeneration, lumbosacral region: Secondary | ICD-10-CM | POA: Diagnosis not present

## 2015-11-01 ENCOUNTER — Telehealth: Payer: Self-pay | Admitting: Surgery

## 2015-11-01 ENCOUNTER — Other Ambulatory Visit: Payer: Self-pay | Admitting: Neurosurgery

## 2015-11-01 NOTE — Telephone Encounter (Signed)
Spoke w/ pt to sch appt on 11/22/15 at 11:45.

## 2015-11-01 NOTE — Telephone Encounter (Signed)
-----   Message from Phillips Odorarol S Pullins, RN sent at 11/01/2015  3:24 PM EDT ----- Regarding: needs office consult with Dr. Myra GianottiBrabham Please schedule this pt. For a new patient consult with Dr. Myra GianottiBrabham prior to ALIF scheduled on 12/15/15.  Please remind the pt. To bring a copy of the LS spine film to the appt. With her.

## 2015-11-04 ENCOUNTER — Other Ambulatory Visit: Payer: Self-pay

## 2015-11-10 ENCOUNTER — Encounter: Payer: Self-pay | Admitting: Surgery

## 2015-11-22 ENCOUNTER — Ambulatory Visit (INDEPENDENT_AMBULATORY_CARE_PROVIDER_SITE_OTHER): Payer: Medicaid Other | Admitting: Surgery

## 2015-11-22 ENCOUNTER — Encounter: Payer: Self-pay | Admitting: Surgery

## 2015-11-22 VITALS — BP 142/96 | HR 64 | Ht 71.0 in | Wt 226.2 lb

## 2015-11-22 DIAGNOSIS — M4317 Spondylolisthesis, lumbosacral region: Secondary | ICD-10-CM
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
## 2015-12-06 NOTE — Pre-Procedure Instructions (Signed)
Jeanne PandyLinda Hamilton  12/06/2015      [facility] [address] [place] ([place]), [place] - [address] [place] [id number] Phone: [phone] Fax: [phone]    Your procedure is scheduled on Wednesday, Dec 15, 2015  [facility] Admitting at 7:00 A.M.  Call this number if you have problems the morning of surgery:  [phone]   Remember:  Do not eat food or drink liquids after midnight Tuesday, Dec 14, 2015  Take these medicines the morning of surgery with A SIP OF WATER :vsertraline (ZOLOFT), if needed:traMADol (ULTRAM) for pain Stop taking Aspirin, vitamins, fish oil and herbal medications. Do not take any NSAIDs ie: Ibuprofen, Advil, Naproxen, BC and Goody Powder or any medication containing Aspirin; stop Wednesday, Dec 08, 2015.  Do not wear jewelry, make-up or nail polish.  Do not wear lotions, powders, or perfumes.  You may not wear deodorant.  Do not shave 48 hours prior to surgery.    Do not bring valuables to the hospital.  [facility] is not responsible for any belongings or valuables.  Contacts, dentures or bridgework may not be worn into surgery.  Leave your suitcase in the car.  After surgery it may be brought to your room.  For patients admitted to the hospital, discharge time will be determined by your treatment team.  Patients discharged the day of surgery will not be allowed to drive home.   Name and phone number of your driver:   Special instructions: [facility] - Preparing for Surgery  Before surgery, you can play an important role.  Because skin is not sterile, your skin needs to be as free of germs as possible.  You can reduce the number of germs on you skin by washing with CHG (chlorahexidine gluconate) soap before surgery.  CHG is an antiseptic cleaner which kills germs and bonds with the skin to continue killing germs even after washing.  Please DO NOT use if you have an allergy to CHG or  antibacterial soaps.  If your skin becomes reddened/irritated stop using the CHG and inform your nurse when you arrive at Short Stay.  Do not shave (including legs and underarms) for at least 48 hours prior to the first CHG shower.  You may shave your face.  Please follow these instructions carefully:   1.  Shower with CHG Soap the night before surgery and the morning of Surgery.  2.  If you choose to wash your hair, wash your hair first as usual with your normal shampoo.  3.  After you shampoo, rinse your hair and body thoroughly to remove the Shampoo.  4.  Use CHG as you would any other liquid soap.  You can apply chg directly  to the skin and wash gently with scrungie or a clean washcloth.  5.  Apply the CHG Soap to your body ONLY FROM THE NECK DOWN.  Do not use on open wounds or open sores.  Avoid contact with your eyes, ears, mouth and genitals (private parts).  Wash genitals (private parts) with your normal soap.  6.  Wash thoroughly, paying special attention to the area where your surgery will be performed.  7.  Thoroughly rinse your body with warm water from the neck down.  8.  DO NOT shower/wash with your normal soap after using and rinsing off the CHG Soap.  9.  Pat yourself dry with a clean towel.  10.  Wear clean pajamas.            11.  Place clean sheets on your bed the night of your first shower and do not sleep with pets.  Day of Surgery  Do not apply any lotions/deodorants the morning of surgery.  Please wear clean clothes to the hospital/surgery center.  Please read over the following fact sheets that you were given. Pain Booklet, Coughing and Deep Breathing, MRSA Information and Surgical Site Infection Prevention

## 2015-12-07 ENCOUNTER — Encounter (HOSPITAL_COMMUNITY): Payer: Self-pay

## 2015-12-07 ENCOUNTER — Encounter (HOSPITAL_COMMUNITY)
Admission: RE | Admit: 2015-12-07 | Discharge: 2015-12-07 | Disposition: A | Discharge status: Home / Self Care | Payer: Medicaid Other | Source: Ambulatory Visit | Attending: Neurosurgery | Admitting: Neurosurgery

## 2015-12-07 DIAGNOSIS — Z0183 Encounter for blood typing: Secondary | ICD-10-CM | POA: Insufficient documentation

## 2015-12-07 DIAGNOSIS — R001 Bradycardia, unspecified: Secondary | ICD-10-CM | POA: Insufficient documentation

## 2015-12-07 DIAGNOSIS — M5136 Other intervertebral disc degeneration, lumbar region: Secondary | ICD-10-CM | POA: Insufficient documentation

## 2015-12-07 DIAGNOSIS — Z01818 Encounter for other preprocedural examination: Secondary | ICD-10-CM | POA: Insufficient documentation

## 2015-12-07 DIAGNOSIS — Z01812 Encounter for preprocedural laboratory examination: Secondary | ICD-10-CM | POA: Diagnosis not present

## 2015-12-07 DIAGNOSIS — I10 Essential (primary) hypertension: Secondary | ICD-10-CM | POA: Diagnosis not present

## 2015-12-07 HISTORY — DX: Cardiac murmur, unspecified: R01.1

## 2015-12-07 HISTORY — DX: Other mental disorders complicating the puerperium: O99.345

## 2015-12-07 HISTORY — DX: Postpartum depression: F53.0

## 2015-12-07 HISTORY — DX: Other intervertebral disc degeneration, lumbar region: M51.36

## 2015-12-07 HISTORY — DX: Essential (primary) hypertension: I10

## 2015-12-07 HISTORY — DX: Nausea with vomiting, unspecified: R11.2

## 2015-12-07 HISTORY — DX: Other intervertebral disc degeneration, lumbar region without mention of lumbar back pain or lower extremity pain: M51.369

## 2015-12-07 HISTORY — DX: Nausea with vomiting, unspecified: Z98.890

## 2015-12-07 LAB — TYPE AND SCREEN
ABO/RH(D): O POS
ANTIBODY SCREEN: NEGATIVE

## 2015-12-07 LAB — CBC
HCT: 41.4 % (ref 36.0–46.0)
Hemoglobin: 13 g/dL (ref 12.0–15.0)
MCH: 26.7 pg (ref 26.0–34.0)
MCHC: 31.4 g/dL (ref 30.0–36.0)
MCV: 85.2 fL (ref 78.0–100.0)
PLATELETS: 158 10*3/uL (ref 150–400)
RBC: 4.86 MIL/uL (ref 3.87–5.11)
RDW: 13 % (ref 11.5–15.5)
WBC: 4.5 10*3/uL (ref 4.0–10.5)

## 2015-12-07 LAB — BASIC METABOLIC PANEL
Anion gap: 12 (ref 5–15)
BUN: 8 mg/dL (ref 6–20)
CALCIUM: 9.7 mg/dL (ref 8.9–10.3)
CO2: 24 mmol/L (ref 22–32)
CREATININE: 0.65 mg/dL (ref 0.44–1.00)
Chloride: 104 mmol/L (ref 101–111)
GFR calc Af Amer: >60 mL/min (ref >60)
GFR calc non Af Amer: >60 mL/min (ref >60)
GLUCOSE: 92 mg/dL (ref 65–99)
Potassium: 4 mmol/L (ref 3.5–5.1)
Sodium: 140 mmol/L (ref 135–145)

## 2015-12-07 LAB — SURGICAL PCR SCREEN
MRSA, PCR: NEGATIVE
Staphylococcus aureus: NEGATIVE

## 2015-12-07 LAB — ABO/RH: ABO/RH(D): O POS

## 2015-12-07 LAB — HCG, SERUM, QUALITATIVE: PREG SERUM: NEGATIVE

## 2015-12-07 NOTE — Progress Notes (Signed)
Pt denies SOB, chest pain, and being under the care of a cardiologist. Pt denies having a stress test, echo and cardiac cath. Pt stated that she wore a holter monitor about 5 years ago when pregnant but there were no significant findings; records requested.

## 2015-12-10 NOTE — Progress Notes (Signed)
Spoke with medical records and re-requested EKG, Echo, stress, ov note and holter monitor report ([phone])

## 2015-12-13 NOTE — Progress Notes (Signed)
Re-Requested ekg/echo/stress test/offive note/holter monitor report/any other studies from [facility] [phone] [phone].

## 2015-12-14 ENCOUNTER — Encounter (HOSPITAL_COMMUNITY): Payer: Self-pay | Admitting: Anesthesiology

## 2015-12-14 NOTE — Anesthesia Preprocedure Evaluation (Addendum)
Anesthesia Evaluation  Patient identified by MRN, date of birth, ID band Patient awake    Reviewed: Allergy & Precautions, NPO status , Patient's Chart, lab work & pertinent test results  History of Anesthesia Complications (+) PONV and history of anesthetic complications  Airway Mallampati: II  TM Distance: >3 FB Neck ROM: Full    Dental  (+) Teeth Intact   Pulmonary former smoker,    Pulmonary exam normal breath sounds clear to auscultation       Cardiovascular hypertension, Normal cardiovascular exam+ Valvular Problems/Murmurs  Rhythm:Regular Rate:Normal     Neuro/Psych PSYCHIATRIC DISORDERS Depression negative neurological ROS     GI/Hepatic negative GI ROS, Neg liver ROS,   Endo/Other  Obesity   Renal/GU negative Renal ROS  negative genitourinary   Musculoskeletal  (+) Arthritis , DDD lumbar spine   Abdominal (+) + obese,   Peds  Hematology negative hematology ROS (+)   Anesthesia Other Findings   Reproductive/Obstetrics                            Anesthesia Physical Anesthesia Plan  ASA: II  Anesthesia Plan: General   Post-op Pain Management:    Induction: Intravenous  Airway Management Planned: Oral ETT  Additional Equipment:   Intra-op Plan:   Post-operative Plan: Extubation in OR  Informed Consent: I have reviewed the patients History and Physical, chart, labs and discussed the procedure including the risks, benefits and alternatives for the proposed anesthesia with the patient or authorized representative who has indicated his/her understanding and acceptance.   Dental advisory given  Plan Discussed with: Anesthesiologist, CRNA and Surgeon  Anesthesia Plan Comments:         Anesthesia Quick Evaluation

## 2015-12-15 ENCOUNTER — Inpatient Hospital Stay (HOSPITAL_COMMUNITY): Payer: Medicaid Other

## 2015-12-15 ENCOUNTER — Inpatient Hospital Stay (HOSPITAL_COMMUNITY): Payer: Medicaid Other | Admitting: Anesthesiology

## 2015-12-15 ENCOUNTER — Inpatient Hospital Stay (HOSPITAL_COMMUNITY)
Admission: RE | Admit: 2015-12-15 | Discharge: 2015-12-20 | DRG: 460 | Disposition: A | Discharge status: Home / Self Care | Payer: Medicaid Other | Source: Ambulatory Visit | Attending: Neurosurgery | Admitting: Neurosurgery

## 2015-12-15 ENCOUNTER — Encounter (HOSPITAL_COMMUNITY)
Admission: RE | Disposition: A | Discharge status: Home / Self Care | Payer: Self-pay | Source: Ambulatory Visit | Attending: Neurosurgery

## 2015-12-15 ENCOUNTER — Encounter (HOSPITAL_COMMUNITY): Payer: Self-pay | Admitting: *Deleted

## 2015-12-15 DIAGNOSIS — M5136 Other intervertebral disc degeneration, lumbar region: Principal | ICD-10-CM | POA: Diagnosis present

## 2015-12-15 DIAGNOSIS — M51369 Other intervertebral disc degeneration, lumbar region without mention of lumbar back pain or lower extremity pain: Secondary | ICD-10-CM | POA: Diagnosis present

## 2015-12-15 DIAGNOSIS — M79606 Pain in leg, unspecified: Secondary | ICD-10-CM

## 2015-12-15 DIAGNOSIS — Z87891 Personal history of nicotine dependence: Secondary | ICD-10-CM | POA: Diagnosis not present

## 2015-12-15 DIAGNOSIS — M79662 Pain in left lower leg: Secondary | ICD-10-CM | POA: Diagnosis not present

## 2015-12-15 DIAGNOSIS — M549 Dorsalgia, unspecified: Secondary | ICD-10-CM | POA: Diagnosis present

## 2015-12-15 DIAGNOSIS — Z419 Encounter for procedure for purposes other than remedying health state, unspecified: Secondary | ICD-10-CM

## 2015-12-15 DIAGNOSIS — Z888 Allergy status to other drugs, medicaments and biological substances status: Secondary | ICD-10-CM | POA: Diagnosis not present

## 2015-12-15 DIAGNOSIS — Z79899 Other long term (current) drug therapy: Secondary | ICD-10-CM

## 2015-12-15 DIAGNOSIS — M5137 Other intervertebral disc degeneration, lumbosacral region: Secondary | ICD-10-CM | POA: Diagnosis present

## 2015-12-15 HISTORY — PX: ANTERIOR LUMBAR FUSION: SHX1170

## 2015-12-15 HISTORY — PX: ABDOMINAL EXPOSURE: SHX5708

## 2015-12-15 SURGERY — ANTERIOR LUMBAR FUSION 1 LEVEL
Anesthesia: General | Site: Abdomen

## 2015-12-15 MED ORDER — BISACODYL 5 MG PO TBEC
5.0000 mg | DELAYED_RELEASE_TABLET | Freq: Every day | ORAL | Status: DC | PRN
  Administered 2015-12-18: 5 mg via ORAL
  Filled 2015-12-15: qty 1

## 2015-12-15 MED ORDER — SUFENTANIL CITRATE 50 MCG/ML IV SOLN
INTRAVENOUS | Status: AC
  Filled 2015-12-15: qty 1

## 2015-12-15 MED ORDER — DEXMEDETOMIDINE HCL IN NACL 400 MCG/100ML IV SOLN
0.4000 ug/kg/h | INTRAVENOUS | Status: DC
  Filled 2015-12-15: qty 100

## 2015-12-15 MED ORDER — ONDANSETRON HCL 4 MG/2ML IJ SOLN
4.0000 mg | INTRAMUSCULAR | Status: DC | PRN
  Administered 2015-12-15: 4 mg via INTRAVENOUS
  Filled 2015-12-15: qty 2

## 2015-12-15 MED ORDER — PHENYLEPHRINE 40 MCG/ML (10ML) SYRINGE FOR IV PUSH (FOR BLOOD PRESSURE SUPPORT)
PREFILLED_SYRINGE | INTRAVENOUS | Status: AC
  Filled 2015-12-15: qty 10

## 2015-12-15 MED ORDER — HYDROMORPHONE HCL 1 MG/ML IJ SOLN
INTRAMUSCULAR | Status: AC
  Filled 2015-12-15: qty 1

## 2015-12-15 MED ORDER — OXYCODONE-ACETAMINOPHEN 5-325 MG PO TABS
1.0000 | ORAL_TABLET | ORAL | Status: DC | PRN
  Administered 2015-12-15: 1 via ORAL
  Administered 2015-12-15 – 2015-12-20 (×15): 2 via ORAL
  Filled 2015-12-15 (×11): qty 2
  Filled 2015-12-15: qty 1
  Filled 2015-12-15 (×3): qty 2

## 2015-12-15 MED ORDER — HYDROCODONE-ACETAMINOPHEN 5-325 MG PO TABS
1.0000 | ORAL_TABLET | ORAL | Status: DC | PRN
  Filled 2015-12-15: qty 2

## 2015-12-15 MED ORDER — SODIUM CHLORIDE 0.9 % IJ SOLN
INTRAMUSCULAR | Status: AC
  Filled 2015-12-15: qty 10

## 2015-12-15 MED ORDER — ROCURONIUM BROMIDE 50 MG/5ML IV SOLN
INTRAVENOUS | Status: AC
  Filled 2015-12-15: qty 2

## 2015-12-15 MED ORDER — ACETAMINOPHEN 10 MG/ML IV SOLN
1000.0000 mg | INTRAVENOUS | Status: AC
  Administered 2015-12-15: 1000 mg via INTRAVENOUS
  Filled 2015-12-15: qty 100

## 2015-12-15 MED ORDER — PHENYLEPHRINE HCL 10 MG/ML IJ SOLN
INTRAMUSCULAR | Status: DC | PRN
  Administered 2015-12-15 (×2): 40 ug via INTRAVENOUS
  Administered 2015-12-15: 80 ug via INTRAVENOUS

## 2015-12-15 MED ORDER — HYDRALAZINE HCL 20 MG/ML IJ SOLN
INTRAMUSCULAR | Status: DC | PRN
  Administered 2015-12-15: 2 mg via INTRAVENOUS

## 2015-12-15 MED ORDER — LACTATED RINGERS IV SOLN
INTRAVENOUS | Status: DC
  Administered 2015-12-15 (×2): via INTRAVENOUS

## 2015-12-15 MED ORDER — NICARDIPINE HCL IN NACL 20-0.86 MG/200ML-% IV SOLN
3.0000 mg/h | INTRAVENOUS | Status: DC
  Filled 2015-12-15: qty 200

## 2015-12-15 MED ORDER — CHLORHEXIDINE GLUCONATE 4 % EX LIQD: 60.0000 mL | Freq: Once | CUTANEOUS | Status: DC

## 2015-12-15 MED ORDER — METOCLOPRAMIDE HCL 5 MG/ML IJ SOLN
INTRAMUSCULAR | Status: AC
  Filled 2015-12-15: qty 2

## 2015-12-15 MED ORDER — HYDROMORPHONE HCL 1 MG/ML IJ SOLN
0.5000 mg | INTRAMUSCULAR | Status: DC | PRN
  Administered 2015-12-15 – 2015-12-16 (×4): 1 mg via INTRAVENOUS
  Administered 2015-12-16: 0.5 mg via INTRAVENOUS
  Administered 2015-12-16 – 2015-12-20 (×20): 1 mg via INTRAVENOUS
  Filled 2015-12-15 (×26): qty 1

## 2015-12-15 MED ORDER — POTASSIUM CHLORIDE IN NACL 20-0.9 MEQ/L-% IV SOLN
INTRAVENOUS | Status: DC
  Administered 2015-12-15: 20:00:00 via INTRAVENOUS
  Filled 2015-12-15 (×10): qty 1000

## 2015-12-15 MED ORDER — GLYCOPYRROLATE 0.2 MG/ML IJ SOLN
INTRAMUSCULAR | Status: DC | PRN
  Administered 2015-12-15: .4 mg via INTRAVENOUS

## 2015-12-15 MED ORDER — SUCCINYLCHOLINE CHLORIDE 200 MG/10ML IV SOSY
PREFILLED_SYRINGE | INTRAVENOUS | Status: AC
  Filled 2015-12-15: qty 10

## 2015-12-15 MED ORDER — DEXMEDETOMIDINE HCL IN NACL 400 MCG/100ML IV SOLN
INTRAVENOUS | Status: DC | PRN
  Administered 2015-12-15: .3 ug/kg/h via INTRAVENOUS

## 2015-12-15 MED ORDER — SUFENTANIL CITRATE 50 MCG/ML IV SOLN
INTRAVENOUS | Status: DC | PRN
  Administered 2015-12-15 (×2): 20 ug via INTRAVENOUS
  Administered 2015-12-15: 5 ug via INTRAVENOUS
  Administered 2015-12-15: 10 ug via INTRAVENOUS
  Administered 2015-12-15: 20 ug via INTRAVENOUS
  Administered 2015-12-15: 30 ug via INTRAVENOUS
  Administered 2015-12-15: 5 ug via INTRAVENOUS

## 2015-12-15 MED ORDER — CEFAZOLIN SODIUM-DEXTROSE 2-4 GM/100ML-% IV SOLN
2.0000 g | INTRAVENOUS | Status: AC
  Administered 2015-12-15: 2 g via INTRAVENOUS
  Filled 2015-12-15: qty 100

## 2015-12-15 MED ORDER — MENTHOL 3 MG MT LOZG: 1.0000 | LOZENGE | OROMUCOSAL | Status: DC | PRN

## 2015-12-15 MED ORDER — SERTRALINE HCL 100 MG PO TABS
150.0000 mg | ORAL_TABLET | Freq: Every day | ORAL | Status: DC
  Administered 2015-12-16 – 2015-12-20 (×5): 150 mg via ORAL
  Filled 2015-12-15 (×5): qty 1

## 2015-12-15 MED ORDER — SENNOSIDES-DOCUSATE SODIUM 8.6-50 MG PO TABS
1.0000 | ORAL_TABLET | Freq: Every evening | ORAL | Status: DC | PRN
Start: 2015-12-15 — End: 2015-12-20

## 2015-12-15 MED ORDER — OXYCODONE-ACETAMINOPHEN 5-325 MG PO TABS
ORAL_TABLET | ORAL | Status: AC
  Filled 2015-12-15: qty 2

## 2015-12-15 MED ORDER — NEOSTIGMINE METHYLSULFATE 10 MG/10ML IV SOLN
INTRAVENOUS | Status: DC | PRN
  Administered 2015-12-15: 3 mg via INTRAVENOUS

## 2015-12-15 MED ORDER — PROPOFOL 10 MG/ML IV BOLUS
INTRAVENOUS | Status: AC
  Filled 2015-12-15: qty 20

## 2015-12-15 MED ORDER — MIDAZOLAM HCL 5 MG/5ML IJ SOLN
INTRAMUSCULAR | Status: DC | PRN
  Administered 2015-12-15: 2 mg via INTRAVENOUS

## 2015-12-15 MED ORDER — LABETALOL HCL 5 MG/ML IV SOLN
INTRAVENOUS | Status: AC
  Filled 2015-12-15: qty 4

## 2015-12-15 MED ORDER — MIDAZOLAM HCL 2 MG/2ML IJ SOLN
INTRAMUSCULAR | Status: AC
  Filled 2015-12-15: qty 2

## 2015-12-15 MED ORDER — PHENOL 1.4 % MT LIQD: 1.0000 | OROMUCOSAL | Status: DC | PRN

## 2015-12-15 MED ORDER — LIDOCAINE HCL (CARDIAC) 20 MG/ML IV SOLN
INTRAVENOUS | Status: DC | PRN
  Administered 2015-12-15: 80 mg via INTRAVENOUS

## 2015-12-15 MED ORDER — SODIUM CHLORIDE 0.9% FLUSH
3.0000 mL | Freq: Two times a day (BID) | INTRAVENOUS | Status: DC
  Administered 2015-12-15 – 2015-12-20 (×11): 3 mL via INTRAVENOUS

## 2015-12-15 MED ORDER — ACETAMINOPHEN 650 MG RE SUPP: 650.0000 mg | RECTAL | Status: DC | PRN

## 2015-12-15 MED ORDER — SURGIFOAM 100 EX MISC
CUTANEOUS | Status: DC | PRN
  Administered 2015-12-15: 10:00:00 via TOPICAL

## 2015-12-15 MED ORDER — SALINE SPRAY 0.65 % NA SOLN: 1.0000 | NASAL | Status: DC | PRN

## 2015-12-15 MED ORDER — SODIUM CHLORIDE 0.9 % IV SOLN: 250.0000 mL | INTRAVENOUS | Status: DC

## 2015-12-15 MED ORDER — ZOLPIDEM TARTRATE 5 MG PO TABS
5.0000 mg | ORAL_TABLET | Freq: Every evening | ORAL | Status: DC | PRN
  Administered 2015-12-18 – 2015-12-20 (×3): 5 mg via ORAL
  Filled 2015-12-15 (×3): qty 1

## 2015-12-15 MED ORDER — SCOPOLAMINE 1 MG/3DAYS TD PT72
MEDICATED_PATCH | TRANSDERMAL | Status: AC
  Filled 2015-12-15: qty 1

## 2015-12-15 MED ORDER — METOCLOPRAMIDE HCL 5 MG/ML IJ SOLN
10.0000 mg | Freq: Once | INTRAMUSCULAR | Status: AC | PRN
  Administered 2015-12-15: 10 mg via INTRAVENOUS

## 2015-12-15 MED ORDER — ACETAMINOPHEN 325 MG PO TABS
650.0000 mg | ORAL_TABLET | ORAL | Status: DC | PRN
  Administered 2015-12-15 – 2015-12-16 (×2): 650 mg via ORAL
  Filled 2015-12-15 (×2): qty 2

## 2015-12-15 MED ORDER — 0.9 % SODIUM CHLORIDE (POUR BTL) OPTIME
TOPICAL | Status: DC | PRN
  Administered 2015-12-15 (×2): 1000 mL

## 2015-12-15 MED ORDER — SCOPOLAMINE 1 MG/3DAYS TD PT72
1.0000 | MEDICATED_PATCH | TRANSDERMAL | Status: DC
  Administered 2015-12-15: 1.5 mg via TRANSDERMAL

## 2015-12-15 MED ORDER — DIAZEPAM 5 MG PO TABS
5.0000 mg | ORAL_TABLET | Freq: Four times a day (QID) | ORAL | Status: DC | PRN
Start: 2015-12-15 — End: 2015-12-20
  Administered 2015-12-15 – 2015-12-19 (×10): 5 mg via ORAL
  Filled 2015-12-15 (×10): qty 1

## 2015-12-15 MED ORDER — PROPOFOL 10 MG/ML IV BOLUS
INTRAVENOUS | Status: DC | PRN
  Administered 2015-12-15: 200 mg via INTRAVENOUS

## 2015-12-15 MED ORDER — ONDANSETRON HCL 4 MG/2ML IJ SOLN
INTRAMUSCULAR | Status: DC | PRN
  Administered 2015-12-15 (×2): 4 mg via INTRAVENOUS

## 2015-12-15 MED ORDER — LACTATED RINGERS IV SOLN
INTRAVENOUS | Status: DC | PRN
  Administered 2015-12-15 (×2): via INTRAVENOUS

## 2015-12-15 MED ORDER — LIDOCAINE 2% (20 MG/ML) 5 ML SYRINGE
INTRAMUSCULAR | Status: AC
  Filled 2015-12-15: qty 5

## 2015-12-15 MED ORDER — PRENATAL PLUS 27-1 MG PO TABS
1.0000 | ORAL_TABLET | Freq: Every day | ORAL | Status: DC
  Administered 2015-12-16 – 2015-12-20 (×5): 1 via ORAL
  Filled 2015-12-15 (×6): qty 1

## 2015-12-15 MED ORDER — BACITRACIN ZINC 500 UNIT/GM EX OINT
TOPICAL_OINTMENT | CUTANEOUS | Status: DC | PRN
  Administered 2015-12-15: 1 via TOPICAL

## 2015-12-15 MED ORDER — SODIUM CHLORIDE 0.9% FLUSH: 3.0000 mL | INTRAVENOUS | Status: DC | PRN

## 2015-12-15 MED ORDER — LABETALOL HCL 5 MG/ML IV SOLN
INTRAVENOUS | Status: DC | PRN
  Administered 2015-12-15: 5 mg via INTRAVENOUS

## 2015-12-15 MED ORDER — HYDROMORPHONE HCL 1 MG/ML IJ SOLN
0.2500 mg | INTRAMUSCULAR | Status: DC | PRN
  Administered 2015-12-15 (×4): 0.5 mg via INTRAVENOUS

## 2015-12-15 MED ORDER — ROCURONIUM BROMIDE 100 MG/10ML IV SOLN
INTRAVENOUS | Status: DC | PRN
  Administered 2015-12-15: 10 mg via INTRAVENOUS
  Administered 2015-12-15: 50 mg via INTRAVENOUS
  Administered 2015-12-15 (×2): 20 mg via INTRAVENOUS

## 2015-12-15 MED ORDER — MEPERIDINE HCL 25 MG/ML IJ SOLN: 6.2500 mg | INTRAMUSCULAR | Status: DC | PRN

## 2015-12-15 MED ORDER — EPHEDRINE 5 MG/ML INJ
INTRAVENOUS | Status: AC
  Filled 2015-12-15: qty 10

## 2015-12-15 MED ORDER — ONDANSETRON HCL 4 MG/2ML IJ SOLN
INTRAMUSCULAR | Status: AC
  Filled 2015-12-15: qty 4

## 2015-12-15 MED ORDER — SENNA 8.6 MG PO TABS
1.0000 | ORAL_TABLET | Freq: Two times a day (BID) | ORAL | Status: DC
  Administered 2015-12-15 – 2015-12-20 (×10): 8.6 mg via ORAL
  Filled 2015-12-15 (×10): qty 1

## 2015-12-15 SURGICAL SUPPLY — 111 items
APPLIER CLIP 11 MED OPEN (CLIP) ×3
BONE MATRIX VIVIGEN 5CC (Bone Implant) ×3 IMPLANT
BUR BARREL STRAIGHT FLUTE 4.0 (BURR) ×3 IMPLANT
BUR MATCHSTICK NEURO 3.0 LAGG (BURR) IMPLANT
CANISTER SUCT 3000ML PPV (MISCELLANEOUS) ×3 IMPLANT
CLIP APPLIE 11 MED OPEN (CLIP) ×1 IMPLANT
CLIP TI MEDIUM 24 (CLIP) IMPLANT
CLIP TI WIDE RED SMALL 24 (CLIP) IMPLANT
COVER BACK TABLE 24X17X13 BIG (DRAPES) IMPLANT
COVER BACK TABLE 60X90IN (DRAPES) ×3 IMPLANT
DECANTER SPIKE VIAL GLASS SM (MISCELLANEOUS) IMPLANT
DRAIN PENROSE 1/4X12 LTX STRL (WOUND CARE) ×3 IMPLANT
DRAPE C-ARM 42X72 X-RAY (DRAPES) ×9 IMPLANT
DRAPE INCISE IOBAN 66X45 STRL (DRAPES) IMPLANT
DRAPE LAPAROTOMY 100X72X124 (DRAPES) ×3 IMPLANT
DRAPE POUCH INSTRU U-SHP 10X18 (DRAPES) ×3 IMPLANT
DRSG OPSITE POSTOP 4X6 (GAUZE/BANDAGES/DRESSINGS) IMPLANT
DRSG OPSITE POSTOP 4X8 (GAUZE/BANDAGES/DRESSINGS) ×3 IMPLANT
ELECT BLADE 4.0 EZ CLEAN MEGAD (MISCELLANEOUS) ×3
ELECT BLADE 6.5 EXT (BLADE) ×3 IMPLANT
ELECT REM PT RETURN 9FT ADLT (ELECTROSURGICAL) ×3
ELECTRODE BLDE 4.0 EZ CLN MEGD (MISCELLANEOUS) ×1 IMPLANT
ELECTRODE REM PT RTRN 9FT ADLT (ELECTROSURGICAL) ×1 IMPLANT
GAUZE SPONGE 4X4 12PLY STRL (GAUZE/BANDAGES/DRESSINGS) ×3 IMPLANT
GAUZE SPONGE 4X4 16PLY XRAY LF (GAUZE/BANDAGES/DRESSINGS) IMPLANT
GLOVE BIO SURGEON STRL SZ 6.5 (GLOVE) IMPLANT
GLOVE BIO SURGEON STRL SZ7 (GLOVE) IMPLANT
GLOVE BIO SURGEON STRL SZ7.5 (GLOVE) IMPLANT
GLOVE BIO SURGEON STRL SZ8 (GLOVE) IMPLANT
GLOVE BIO SURGEON STRL SZ8.5 (GLOVE) IMPLANT
GLOVE BIO SURGEONS STRL SZ 6.5 (GLOVE)
GLOVE BIOGEL M 8.0 STRL (GLOVE) IMPLANT
GLOVE BIOGEL PI IND STRL 7.0 (GLOVE) ×4 IMPLANT
GLOVE BIOGEL PI IND STRL 7.5 (GLOVE) ×1 IMPLANT
GLOVE BIOGEL PI IND STRL 8 (GLOVE) ×1 IMPLANT
GLOVE BIOGEL PI INDICATOR 7.0 (GLOVE) ×8
GLOVE BIOGEL PI INDICATOR 7.5 (GLOVE) ×2
GLOVE BIOGEL PI INDICATOR 8 (GLOVE) ×2
GLOVE ECLIPSE 6.5 STRL STRAW (GLOVE) ×3 IMPLANT
GLOVE ECLIPSE 7.0 STRL STRAW (GLOVE) IMPLANT
GLOVE ECLIPSE 7.5 STRL STRAW (GLOVE) ×3 IMPLANT
GLOVE ECLIPSE 8.0 STRL XLNG CF (GLOVE) IMPLANT
GLOVE ECLIPSE 8.5 STRL (GLOVE) IMPLANT
GLOVE EXAM NITRILE LRG STRL (GLOVE) IMPLANT
GLOVE EXAM NITRILE MD LF STRL (GLOVE) IMPLANT
GLOVE EXAM NITRILE XL STR (GLOVE) IMPLANT
GLOVE EXAM NITRILE XS STR PU (GLOVE) IMPLANT
GLOVE INDICATOR 6.5 STRL GRN (GLOVE) IMPLANT
GLOVE INDICATOR 7.0 STRL GRN (GLOVE) IMPLANT
GLOVE INDICATOR 7.5 STRL GRN (GLOVE) IMPLANT
GLOVE INDICATOR 8.0 STRL GRN (GLOVE) IMPLANT
GLOVE INDICATOR 8.5 STRL (GLOVE) IMPLANT
GLOVE OPTIFIT SS 8.0 STRL (GLOVE) IMPLANT
GLOVE SURG SS PI 6.5 STRL IVOR (GLOVE) IMPLANT
GLOVE SURG SS PI 7.5 STRL IVOR (GLOVE) ×3 IMPLANT
GOWN STRL NON-REIN LRG LVL3 (GOWN DISPOSABLE) IMPLANT
GOWN STRL REUS W/ TWL LRG LVL3 (GOWN DISPOSABLE) ×2 IMPLANT
GOWN STRL REUS W/ TWL XL LVL3 (GOWN DISPOSABLE) IMPLANT
GOWN STRL REUS W/TWL 2XL LVL3 (GOWN DISPOSABLE) ×3 IMPLANT
GOWN STRL REUS W/TWL LRG LVL3 (GOWN DISPOSABLE) ×4
GOWN STRL REUS W/TWL XL LVL3 (GOWN DISPOSABLE)
HEMOSTAT SURGICEL 2X14 (HEMOSTASIS) IMPLANT
INSERT FOGARTY 61MM (MISCELLANEOUS) IMPLANT
INSERT FOGARTY SM (MISCELLANEOUS) IMPLANT
KIT BASIN OR (CUSTOM PROCEDURE TRAY) ×3 IMPLANT
KIT INFUSE X SMALL 1.4CC (Orthopedic Implant) ×3 IMPLANT
KIT ROOM TURNOVER OR (KITS) ×3 IMPLANT
LIQUID BAND (GAUZE/BANDAGES/DRESSINGS) ×3 IMPLANT
LOOP VESSEL MAXI BLUE (MISCELLANEOUS) IMPLANT
LOOP VESSEL MINI RED (MISCELLANEOUS) IMPLANT
NEEDLE HYPO 22GX1.5 SAFETY (NEEDLE) ×3 IMPLANT
NEEDLE SPNL 18GX3.5 QUINCKE PK (NEEDLE) ×3 IMPLANT
NS IRRIG 1000ML POUR BTL (IV SOLUTION) ×6 IMPLANT
PACK LAMINECTOMY NEURO (CUSTOM PROCEDURE TRAY) ×3 IMPLANT
PAD ARMBOARD 7.5X6 YLW CONV (MISCELLANEOUS) ×9 IMPLANT
SCREW LOCK FINE TIP 25MM (Screw) ×12 IMPLANT
SPONGE INTESTINAL PEANUT (DISPOSABLE) ×12 IMPLANT
SPONGE LAP 18X18 X RAY DECT (DISPOSABLE) IMPLANT
SPONGE LAP 4X18 X RAY DECT (DISPOSABLE) IMPLANT
SPONGE SURGIFOAM ABS GEL 100 (HEMOSTASIS) ×3 IMPLANT
STAPLER VISISTAT 35W (STAPLE) ×3 IMPLANT
SUT MNCRL AB 4-0 PS2 18 (SUTURE) IMPLANT
SUT PDS AB 1 CTX 36 (SUTURE) ×3 IMPLANT
SUT PROLENE 4 0 RB 1 (SUTURE)
SUT PROLENE 4-0 RB1 .5 CRCL 36 (SUTURE) IMPLANT
SUT PROLENE 5 0 CC1 (SUTURE) ×3 IMPLANT
SUT PROLENE 6 0 C 1 30 (SUTURE) IMPLANT
SUT PROLENE 6 0 CC (SUTURE) IMPLANT
SUT SILK 0 TIES 10X30 (SUTURE) IMPLANT
SUT SILK 2 0 TIES 10X30 (SUTURE) ×3 IMPLANT
SUT SILK 2 0SH CR/8 30 (SUTURE) IMPLANT
SUT SILK 3 0 TIES 10X30 (SUTURE) IMPLANT
SUT SILK 3 0SH CR/8 30 (SUTURE) IMPLANT
SUT STRATAFIX 1PDS 45CM VIOLET (SUTURE) ×3 IMPLANT
SUT VIC AB 0 CT1 18XCR BRD8 (SUTURE) IMPLANT
SUT VIC AB 0 CT1 27 (SUTURE)
SUT VIC AB 0 CT1 27XBRD ANBCTR (SUTURE) IMPLANT
SUT VIC AB 0 CT1 8-18 (SUTURE)
SUT VIC AB 2-0 CT1 18 (SUTURE) ×3 IMPLANT
SUT VIC AB 2-0 CT1 36 (SUTURE) IMPLANT
SUT VIC AB 3-0 SH 27 (SUTURE)
SUT VIC AB 3-0 SH 27X BRD (SUTURE) IMPLANT
SUT VIC AB 3-0 SH 8-18 (SUTURE) IMPLANT
SUT VICRYL 4-0 PS2 18IN ABS (SUTURE) IMPLANT
SYNFIX LR 30X38X12MM PEEK (Orthopedic Implant) ×3 IMPLANT
SYR CONTROL 10ML LL (SYRINGE) ×3 IMPLANT
TOWEL OR 17X24 6PK STRL BLUE (TOWEL DISPOSABLE) IMPLANT
TOWEL OR 17X26 10 PK STRL BLUE (TOWEL DISPOSABLE) ×3 IMPLANT
TRAY FOLEY CATH 16FRSI W/METER (SET/KITS/TRAYS/PACK) ×3 IMPLANT
TRAY FOLEY W/METER SILVER 14FR (SET/KITS/TRAYS/PACK) IMPLANT
WATER STERILE IRR 1000ML POUR (IV SOLUTION) ×3 IMPLANT

## 2015-12-15 NOTE — Anesthesia Postprocedure Evaluation (Signed)
Anesthesia Post Note  Patient: Jeanne PandyLinda Caraveo  Procedure(s) Performed: Procedure(s) (LRB): Lumbar five-sacral one anterior lumbar interbody fusion with interbody prosthesis arthrodesis and instrumentation    (N/A) ABDOMINAL EXPOSURE (N/A)  Patient location during evaluation: PACU Anesthesia Type: General Level of consciousness: awake and alert Pain management: pain level controlled Vital Signs Assessment: post-procedure vital signs reviewed and stable Respiratory status: spontaneous breathing, nonlabored ventilation, respiratory function stable and patient connected to nasal cannula oxygen Cardiovascular status: blood pressure returned to baseline and stable Postop Assessment: no signs of nausea or vomiting Anesthetic complications: no    Last Vitals:  Filed Vitals:   12/15/15 1330 12/15/15 1334  BP: 143/89   Pulse: 62 66  Temp:    Resp: 11 11    Last Pain:  Filed Vitals:   12/15/15 1336  PainSc: 8                  [name]

## 2015-12-15 NOTE — Anesthesia Procedure Notes (Signed)
Procedure Name: Intubation Date/Time: 12/15/2015 9:09 AM Performed by: Coralee RudFLORES, Jeanne Arney Pre-anesthesia Checklist: Patient identified, Emergency Drugs available, Suction available and Patient being monitored Patient Re-evaluated:Patient Re-evaluated prior to inductionOxygen Delivery Method: Circle system utilized Preoxygenation: Pre-oxygenation with 100% oxygen Intubation Type: IV induction Ventilation: Mask ventilation without difficulty Laryngoscope Size: Miller and 3 Grade View: Grade I Tube type: Oral Tube size: 7.5 mm Number of attempts: 1 Airway Equipment and Method: Stylet Placement Confirmation: ETT inserted through vocal cords under direct vision,  positive ETCO2 and breath sounds checked- equal and bilateral Secured at: 22 cm Tube secured with: Tape Dental Injury: Teeth and Oropharynx as per pre-operative assessment

## 2015-12-15 NOTE — Progress Notes (Signed)
Pt coing of left leg filling like its pulling, PAS hose off and pt with same feeling, DR Franky Machoabbell made aware.
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
## 2015-12-15 NOTE — Op Note (Signed)
12/15/2015  12:45 PM  PATIENT:  Reed PandyLinda Noboa  35 y.o. female  PRE-OPERATIVE DIAGNOSIS:  lumbar degenrative disc disease  POST-OPERATIVE DIAGNOSIS:  lumbar degenrative disc disease  PROCEDURE:  Procedure(s): Lumbar five-sacral one anterior lumbar interbody fusion with interbody prosthesis, allograft morsels, BMP  arthrodesis L5/s1  ABDOMINAL EXPOSURE  SURGEON: Surgeon(s): Coletta MemosKyle Mette [place], MD Nada LibmanVance W Brabham, MD  ASSISTANTS:Brabham, Anner CreteWells  ANESTHESIA:   general  EBL:  Total I/O In: 1400 [I.V.:1400] Out: 250 [Urine:175; Blood:75]  BLOOD ADMINISTERED:none  CELL SAVER GIVEN:none  COUNT:per nursing,   DRAINS: none   SPECIMEN:  No Specimen  DICTATION: Reed PandyLinda Cayabyab was taken to the operating room, intubated, and placed under a general anesthetic without difficulty. A foley catheter was placed under sterile conditions.  female Her abdomen was prepped and draped in a sterile manner. We localized the incision with fluoroscopy. Under separate cover Dr. Myra GianottiBrabham will dictate the approach to the L5/S1 disc space.  Once the L5/S1 disc space was identified I started the decompression and arthrodesis. I used the 15 blade to open the disc space and removed the disc in a progressive fashion. I used curettes, rongeurs, and the drill. I did remove some posterior osteophytes with Kerrison punches to effect a full decompression of the thecal sac. I prepared the disc space with various tools, and used a trial to determine the correct height and size. With fluoroscopic guidance I placed the cage(synthes, synfix) filled with bmp and allograft morsels. Final films showed the cage to be in good position.  I irrigated then closed the rectus fascia, and approximated the skin with staples. A sterile dressing was applied.  PLAN OF CARE: Admit to inpatient   PATIENT DISPOSITION:  PACU - hemodynamically stable.   Delay start of Pharmacological VTE agent (>24hrs) due to surgical blood loss or risk of  bleeding:  yes
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
## 2015-12-15 NOTE — Interval H&P Note (Signed)
History and Physical Interval Note:  12/15/2015 8:29 AM  Jeanne PandyLinda Shirah  has presented today for surgery, with the diagnosis of lumbar degenrative disc disease  The various methods of treatment have been discussed with the patient and family. After consideration of risks, benefits and other options for treatment, the patient has consented to  Procedure(s) with comments: ANTERIOR LUMBAR FUSION 1 LEVEL (N/A) - L5S1 anterior lumbar interbody fusion with interbody prosthesis arthrodesis and instrumentation    Uvaldo Rybacki to approach ABDOMINAL EXPOSURE (N/A) as a surgical intervention .  The patient's history has been reviewed, patient examined, no change in status, stable for surgery.  I have reviewed the patient's chart and labs.  Questions were answered to the patient's satisfaction.     Mycah Formica, Wells  No inerval change CV RRR Pulm: CTA Palp pedal pulses Plan anterior L5-S1 exposure  Wells Danaye Sobh

## 2015-12-15 NOTE — Transfer of Care (Signed)
Immediate Anesthesia Transfer of Care Note  Patient: Jeanne Hamilton  Procedure(s) Performed: Procedure(s): Lumbar five-sacral one anterior lumbar interbody fusion with interbody prosthesis arthrodesis and instrumentation    (N/A) ABDOMINAL EXPOSURE (N/A)  Patient Location: PACU  Anesthesia Type:General  Level of Consciousness: awake, sedated and patient cooperative  Airway & Oxygen Therapy: Patient Spontanous Breathing and Patient connected to nasal cannula oxygen  Post-op Assessment: Report given to RN, Post -op Vital signs reviewed and stable, Patient moving all extremities and Patient moving all extremities X 4  Post vital signs: Reviewed and stable  Last Vitals:  Filed Vitals:   12/15/15 0719  BP: 150/92  Pulse: 62  Temp: 36.6 C  Resp: 18    Last Pain:  Filed Vitals:   12/15/15 1232  PainSc: 6       Patients Stated Pain Goal: 0 (12/15/15 0747)  Complications: No apparent anesthesia complications

## 2015-12-15 NOTE — Op Note (Signed)
    Patient name: Jeanne PandyLinda Hamilton MRN: [id number] DOB: 07-11-1981 Sex: female  12/15/2015 Pre-operative Diagnosis: Degenerative back disease Post-operative diagnosis:  Same Surgeon:  Durene CalBrabham, Wells  Co-Surgeon:  Dr. Franky Machoabbell Procedure:   Anterior exposure, L5-S1 Anesthesia:  Gen. Blood Loss:  See anesthesia record Specimens:  None  Findings:  Large venous complex and the pelvis.  Very deep exposure  Indications:  The patient has been seen and evaluated and deemed to be a good candidate for anterior instrumentation for L5-S1 back surgery.  Procedure:  The patient was identified in the holding area and [facility]  The patient was then placed supine on the table. general anesthesia was administered.  The patient was prepped and draped in the usual sterile fashion.  A time out was called and antibiotics were administered.  Fluoroscopy was used to determine the appropriate level of the skin incision.  A transverse left lower quadrant incision was made.  Cautery was used to divide the subcutaneous tissue down to the abdominal wall fascia.  The fascia was divided with cautery.  Subfascial flaps were raised.  The medial border of the rectus muscle was mobilized.  I entered the retroperitoneal space from lateral to the rectus muscle.  This was a very deep exposure and somewhat difficult.  There were multiple venous structures within the pelvis which were avoided.  I identified the iliac artery and vein.  These were mobilized laterally.  The ureter was also identified and protected laterally.  Ultimately the L5-S1 disc space was mobilized.  The Thompson retractor was set up.  200 reversed lip blades were placed on either side of the spine.  190 malleable retractors were placed proximally and distally.  At this point Dr. Franky Machoabbell took over the procedure.  Please see his operative note for details   Disposition:  To PACU in stable condition.   [name], M.D. [facility] [place] Office: [phone] Pager:  [phone]

## 2015-12-15 NOTE — Progress Notes (Signed)
Orthopedic Tech Progress Note Patient Details:  Jeanne PandyLinda Henkes [birth year] [id number] Called [employer] for brace. Patient ID: Jeanne PandyLinda Prindiville, female   DOB: [birth year], 35 y.o.   MRN: [id number]   Lesle ChrisGilliland, Jamon Hayhurst L 12/15/2015, 2:01 PM

## 2015-12-16 ENCOUNTER — Encounter (HOSPITAL_COMMUNITY): Payer: Self-pay | Admitting: Neurosurgery

## 2015-12-16 MED ORDER — DIPHENHYDRAMINE HCL 25 MG PO CAPS: 25.0000 mg | ORAL_CAPSULE | Freq: Four times a day (QID) | ORAL | Status: DC | PRN

## 2015-12-16 MED ORDER — ARTIFICIAL TEARS OP OINT
TOPICAL_OINTMENT | OPHTHALMIC | Status: AC
  Filled 2015-12-16: qty 3.5

## 2015-12-16 MED ORDER — DIPHENHYDRAMINE HCL 25 MG PO CAPS
50.0000 mg | ORAL_CAPSULE | ORAL | Status: DC | PRN
  Administered 2015-12-16 – 2015-12-18 (×4): 50 mg via ORAL
  Filled 2015-12-16 (×4): qty 2

## 2015-12-16 MED ORDER — DIPHENHYDRAMINE HCL 50 MG/ML IJ SOLN: 50.0000 mg | Freq: Four times a day (QID) | INTRAMUSCULAR | Status: DC | PRN

## 2015-12-16 NOTE — Progress Notes (Signed)
    Subjective  - POD #1, s/p Anterior exposure  C/o pain in her left leg Tolerating PO  Physical Exam:  No significant left leg swelling Palpable pedal pulses       Assessment/Plan:  POD #1  Stable from vascular perspective Please call with any questions or concerns   Jeanne Hamilton, Wells 12/16/2015 8:35 PM --  Filed Vitals:   12/16/15 1309 12/16/15 1715  BP: 141/91 133/82  Pulse: 87 77  Temp: 98.2 F (36.8 C) 98.6 F (37 C)  Resp: 18 18    Intake/Output Summary (Last 24 hours) at 12/16/15 2035 Last data filed at 12/16/15 0408  Gross per 24 hour  Intake      0 ml  Output   1000 ml  Net  -1000 ml     Laboratory CBC    Component Value Date/Time   WBC 4.5 12/07/2015 1008   HGB 13.0 12/07/2015 1008   HCT 41.4 12/07/2015 1008   PLT 158 12/07/2015 1008    BMET    Component Value Date/Time   NA 140 12/07/2015 1008   K 4.0 12/07/2015 1008   CL 104 12/07/2015 1008   CO2 24 12/07/2015 1008   GLUCOSE 92 12/07/2015 1008   BUN 8 12/07/2015 1008   CREATININE 0.65 12/07/2015 1008   CALCIUM 9.7 12/07/2015 1008   GFRNONAA >60 12/07/2015 1008   GFRAA >60 12/07/2015 1008    COAG No results found for: INR, PROTIME No results found for: PTT  Antibiotics Anti-infectives    Start     Dose/Rate Route Frequency Ordered Stop   12/15/15 0745  ceFAZolin (ANCEF) IVPB 2g/100 mL premix     2 g 200 mL/hr over 30 Minutes Intravenous To Surgery 12/15/15 0734 12/15/15 0915       V. Charlena CrossWells Brabham IV, M.D. [facility] [place] Office: [phone] Pager:  [phone]

## 2015-12-16 NOTE — Evaluation (Signed)
Physical Therapy Evaluation Patient Details Name: Jeanne Hamilton MRN: [id number] DOB: 1980-09-26 Today's Date: 12/16/2015   History of Present Illness  Pt is a 35 y.o. female s/p ALIF.  Clinical Impression  Patient is s/p above surgery resulting in the deficits listed below (see PT Problem List). On eval, pt required min guard assist for transfers and gait 100 feet without A.D. Gait distance limited by L ankle/calf pain. Pt reports WB and palpation increase pain. Patient will benefit from skilled PT to increase their independence and safety with mobility (while adhering to their precautions) to allow discharge to the venue listed below. PT to follow acutely. No f/u services indicated.     Follow Up Recommendations No PT follow up;Supervision - Intermittent    Equipment Recommendations  None recommended by PT    Recommendations for Other Services       Precautions / Restrictions Precautions Precautions: Back Precaution Booklet Issued: Yes (comment) Precaution Comments: Educated pt on 3/3 back precautions. Handout provided. Required Braces or Orthoses: Spinal Brace Spinal Brace: Lumbar corset;Applied in sitting position      Mobility  Bed Mobility Overal bed mobility: Needs Assistance Bed Mobility: Rolling;Sidelying to Sit Rolling: Modified independent (Device/Increase time) Sidelying to sit: Supervision;HOB elevated       General bed mobility comments: verbal cues for logroll  Transfers Overall transfer level: Needs assistance Equipment used: None Transfers: Sit to/from [employer] Transfers Sit to Stand: Min guard Stand pivot transfers: Min guard       General transfer comment: no physical assist needed  Ambulation/Gait Ambulation/Gait assistance: Min guard Ambulation Distance (Feet): 100 Feet Assistive device: None Gait Pattern/deviations: Antalgic;Step-through pattern;Decreased stride length Gait velocity: decreased Gait velocity interpretation:  Below normal speed for age/gender General Gait Details: pain with WB L ankle/calf  Stairs            Wheelchair Mobility    Modified Rankin (Stroke Patients Only)       Balance Overall balance assessment: No apparent balance deficits (not formally assessed)                                           Pertinent Vitals/Pain Pain Assessment: 0-10 Pain Score: 7  Pain Location: L ankle/calf Pain Descriptors / Indicators: Cramping;Sharp Pain Intervention(s): Monitored during session;Limited activity within patient's tolerance;Repositioned;Premedicated before session    Home Living Family/patient expects to be discharged to:: Private residence Living Arrangements: Spouse/significant other;Children;Other relatives Available Help at Discharge: Family;Available 24 hours/day Type of Home: House Home Access: Ramped entrance     Home Layout: One level Home Equipment: None      Prior Function Level of Independence: Independent               Hand Dominance        Extremity/Trunk Assessment   Upper Extremity Assessment: Defer to OT evaluation           Lower Extremity Assessment: LLE deficits/detail   LLE Deficits / Details: pain with dorsiflexion  Cervical / Trunk Assessment: Normal  Communication   Communication: No difficulties  Cognition Arousal/Alertness: Awake/alert Behavior During Therapy: WFL for tasks assessed/performed Overall Cognitive Status: Within Functional Limits for tasks assessed                      General Comments      Exercises General Exercises - Lower Extremity Ankle Circles/Pumps: AROM;Both;10 reps;Seated  Assessment/Plan    PT Assessment Patient needs continued PT services  PT Diagnosis Difficulty walking;Acute pain   PT Problem List Decreased activity tolerance;Pain;Decreased knowledge of precautions;Decreased mobility  PT Treatment Interventions Gait training;Stair training;Functional  mobility training;Therapeutic activities;Therapeutic exercise;Patient/family education;Balance training   PT Goals (Current goals can be found in the Care Plan section) Acute Rehab PT Goals Patient Stated Goal: home PT Goal Formulation: With patient Time For Goal Achievement: 12/23/15 Potential to Achieve Goals: Good    Frequency Min 5X/week   Barriers to discharge        Co-evaluation               End of Session Equipment Utilized During Treatment: Gait belt;Back brace Activity Tolerance: Patient limited by pain (L ankle/calf) Patient left: in chair;with call bell/phone within reach Nurse Communication: Mobility status         Time: [id number] PT Time Calculation (min) (ACUTE ONLY): 24 min   Charges:   PT Evaluation $PT Eval Moderate Complexity: 1 Procedure PT Treatments $Gait Training: 8-22 mins   PT G Codes:        Ilda FoilGarrow, Jahzion Brogden Rene 12/16/2015, 9:53 AM

## 2015-12-16 NOTE — Progress Notes (Signed)
Patient ID: Reed PandyLinda Ouzts, female   DOB: 02-15-1981, 35 y.o.   MRN: [id number] BP 133/82 mmHg  Pulse 77  Temp(Src) 98.6 F (37 C) (Oral)  Resp 18  Ht 5\' 11"  (1.803 m)  Wt 100.699 kg (222 lb)  BMI 30.98 kg/m2  SpO2 96%  LMP  Alert and oriented x 4, complaining of pain in the left leg. Feels tight around the ankles. No pain in the lower back.  Full strength in the lower extremities. Wound is clean, and dry.

## 2015-12-16 NOTE — Evaluation (Signed)
Occupational Therapy Evaluation and Discharge Patient Details Name: Jeanne Hamilton MRN: [id number] DOB: 09-06-1980 Today's Date: 12/16/2015    History of Present Illness Pt is a 35 y.o. female s/p L5-S1 ALIF. No pertinent PMHx in chart.   Clinical Impression   Pt reports she was independent with ADLs and mobility PTA. Currently pt overall supervision for safety with ADLs and functional mobility. Pt c/o L lower leg pain limiting her independence and safety with functional mobility and ADLs. All back, safety, and ADL education complete. Pt planning to d/c home with 24/7 supervision from family. No further acute OT needs identified; signing off at this time. Please re-consult if needs change. Thank you for this referral.    Follow Up Recommendations  No OT follow up;Supervision - Intermittent    Equipment Recommendations  3 in 1 bedside comode    Recommendations for Other Services       Precautions / Restrictions Precautions Precautions: Back Precaution Booklet Issued: No Precaution Comments: Pt able to verbally recall 3/3 back precautions and maintain throughout session. Required Braces or Orthoses: Spinal Brace Spinal Brace: Lumbar corset;Applied in sitting position Restrictions Weight Bearing Restrictions: No      Mobility Bed Mobility Overal bed mobility: Modified Independent Bed Mobility: Rolling;Sidelying to Sit Rolling: Modified independent (Device/Increase time) Sidelying to sit: Supervision;HOB elevated       General bed mobility comments: Good technique. Light use of bed rails with HOB flat.  Transfers Overall transfer level: Needs assistance Equipment used: None Transfers: Sit to/from Stand Sit to Stand: Supervision Stand pivot transfers: Min guard       General transfer comment: Supervision for safety; no physical assist required. Supervision required secondary to L lower leg pain.    Balance Overall balance assessment: No apparent balance deficits  (not formally assessed)                                          ADL Overall ADL's : Needs assistance/impaired Eating/Feeding: Independent   Grooming: Supervision/safety;Standing Grooming Details (indicate cue type and reason): Educated on use of 2 cups for oral care. Upper Body Bathing: Modified independent;Sitting   Lower Body Bathing: Supervison/ safety;Sit to/from stand   Upper Body Dressing : Sitting;Set up Upper Body Dressing Details (indicate cue type and reason): to don/doff back brace Lower Body Dressing: Supervision/safety;Sit to/from stand Lower Body Dressing Details (indicate cue type and reason): Pt able to cross foot over opposite knee.  Toilet Transfer: Supervision/safety;Ambulation;BSC Toilet Transfer Details (indicate cue type and reason): Educated pt on use of 3 in 1 ovet toilet. Toileting- Clothing Manipulation and Hygiene: Supervision/safety;Sit to/from stand Toileting - Clothing Manipulation Details (indicate cue type and reason): Pt able to reach bottom without twisting. Tub/ Shower Transfer: Supervision/safety;Walk-in shower;Ambulation;Grab bars Tub/Shower Transfer Details (indicate cue type and reason): Educated on use of 3 in 1 in shower as a seat Functional mobility during ADLs: Supervision/safety General ADL Comments: Pt limited by L lower leg pain and limping during mobility secondary to pain. Educated pt on maintaining back precautions during functional activities, donning/doffing brace, home safety, lifting restrictions.      Vision     Perception     Praxis      Pertinent Vitals/Pain Pain Assessment: Faces Pain Score: 7  Faces Pain Scale: Hurts even more Pain Location: L ankle/calf Pain Descriptors / Indicators: Cramping;Sharp Pain Intervention(s): Limited activity within patient's tolerance;Monitored during session  Hand Dominance     Extremity/Trunk Assessment Upper Extremity Assessment Upper Extremity Assessment:  Overall WFL for tasks assessed   Lower Extremity Assessment Lower Extremity Assessment: Defer to PT evaluation LLE Deficits / Details: pain with dorsiflexion   Cervical / Trunk Assessment Cervical / Trunk Assessment: Normal   Communication Communication Communication: No difficulties   Cognition Arousal/Alertness: Awake/alert Behavior During Therapy: WFL for tasks assessed/performed Overall Cognitive Status: Within Functional Limits for tasks assessed                     General Comments       Exercises       Shoulder Instructions      Home Living Family/patient expects to be discharged to:: Private residence Living Arrangements: Spouse/significant other;Children;Other relatives Available Help at Discharge: Family;Available 24 hours/day Type of Home: House Home Access: Ramped entrance     Home Layout: One level     Bathroom Shower/Tub: Producer, television/film/video: Standard     Home Equipment: None          Prior Functioning/Environment Level of Independence: Independent             OT Diagnosis: Acute pain   OT Problem List:     OT Treatment/Interventions:      OT Goals(Current goals can be found in the care plan section) Acute Rehab OT Goals Patient Stated Goal: home OT Goal Formulation: All assessment and education complete, DC therapy  OT Frequency:     Barriers to D/C:            Co-evaluation              End of Session Equipment Utilized During Treatment: Back brace  Activity Tolerance: Patient limited by pain Patient left: in bed;with call bell/phone within reach   Time: 1111-1125 OT Time Calculation (min): 14 min Charges:  OT General Charges $OT Visit: 1 Procedure OT Evaluation $OT Eval Low Complexity: 1 Procedure G-Codes:     Gaye Alken M.S., OTR/L Pager: [phone]  12/16/2015, 11:56 AM

## 2015-12-17 ENCOUNTER — Inpatient Hospital Stay (HOSPITAL_COMMUNITY): Payer: Medicaid Other

## 2015-12-17 DIAGNOSIS — M79662 Pain in left lower leg: Secondary | ICD-10-CM

## 2015-12-17 NOTE — Progress Notes (Signed)
Writer called MD's office to let him know patient is complaining of the left leg pain.

## 2015-12-17 NOTE — Progress Notes (Signed)
Physical Therapy Treatment Patient Details Name: Jeanne Hamilton MRN: [id number] DOB: 27-Mar-1981 Today's Date: 12/17/2015    History of Present Illness Pt is a 35 y.o. female s/p L5-S1 ALIF. No pertinent PMHx in chart.    PT Comments    Mobility continues to be limited by L ankle/calf pain. Pt is now scheduled for CT scan. Pt may need to consider RW for ambulation if pain persists. Today she was unable to tolerate any WB beyond SPT.  Follow Up Recommendations  No PT follow up;Supervision - Intermittent     Equipment Recommendations  None recommended by PT    Recommendations for Other Services       Precautions / Restrictions Precautions Precautions: Back Precaution Comments: Pt able to recall and maintain 3/3 back precautions. Required Braces or Orthoses: Spinal Brace Spinal Brace: Lumbar corset;Applied in sitting position    Mobility  Bed Mobility Overal bed mobility: Modified Independent   Rolling: Modified independent (Device/Increase time) Sidelying to sit: Modified independent (Device/Increase time)       General bed mobility comments: Good technique. Light use of bed rails with HOB flat.  Transfers   Equipment used: None   Sit to Stand: Supervision Stand pivot transfers: Min guard       General transfer comment: Supervision for safety; no physical assist required. Supervision required secondary to L lower leg pain.  Ambulation/Gait             General Gait Details: unable to ambulate due to L ankle/calf pain   Stairs            Wheelchair Mobility    Modified Rankin (Stroke Patients Only)       Balance                                    Cognition Arousal/Alertness: Awake/alert Behavior During Therapy: WFL for tasks assessed/performed Overall Cognitive Status: Within Functional Limits for tasks assessed                      Exercises General Exercises - Lower Extremity Ankle Circles/Pumps: AROM;Both;10  reps;Seated    General Comments        Pertinent Vitals/Pain Pain Assessment: 0-10 Pain Score: 8  Pain Location: L ankle/calf Pain Descriptors / Indicators: Cramping;Squeezing;Sharp Pain Intervention(s): Limited activity within patient's tolerance;Monitored during session;Repositioned;Patient requesting pain meds-RN notified    Home Living                      Prior Function            PT Goals (current goals can now be found in the care plan section) Acute Rehab PT Goals Patient Stated Goal: home PT Goal Formulation: With patient Time For Goal Achievement: 12/23/15 Potential to Achieve Goals: Good Progress towards PT goals: Progressing toward goals    Frequency  Min 5X/week    PT Plan Current plan remains appropriate    Co-evaluation             End of Session Equipment Utilized During Treatment: Gait belt;Back brace Activity Tolerance: Patient limited by pain Patient left: in chair;with call bell/phone within reach     Time: 0910-0923 PT Time Calculation (min) (ACUTE ONLY): 13 min  Charges:  $Therapeutic Activity: 8-22 mins                    G Codes:  Ilda Foil 12/17/2015, 11:40 AM

## 2015-12-17 NOTE — Progress Notes (Signed)
*  Preliminary Results* Left lower extremity venous duplex completed. Left lower extremity is negative for deep vein thrombosis. There is no evidence of left Baker's cyst.  12/17/2015 6:22 PM  Gertie FeyMichelle Daisy Lites, RVT, RDCS, RDMS

## 2015-12-18 MED ORDER — DEXAMETHASONE SODIUM PHOSPHATE 4 MG/ML IJ SOLN
8.0000 mg | Freq: Four times a day (QID) | INTRAMUSCULAR | Status: DC
  Administered 2015-12-18 – 2015-12-20 (×9): 8 mg via INTRAVENOUS
  Filled 2015-12-18 (×10): qty 2

## 2015-12-18 MED ORDER — GABAPENTIN 600 MG PO TABS
300.0000 mg | ORAL_TABLET | Freq: Every day | ORAL | Status: DC
  Administered 2015-12-18 – 2015-12-19 (×2): 300 mg via ORAL
  Filled 2015-12-18 (×2): qty 1

## 2015-12-18 MED ORDER — PANTOPRAZOLE SODIUM 20 MG PO TBEC
20.0000 mg | DELAYED_RELEASE_TABLET | Freq: Two times a day (BID) | ORAL | Status: DC
  Administered 2015-12-18 – 2015-12-20 (×5): 20 mg via ORAL
  Filled 2015-12-18 (×5): qty 1

## 2015-12-18 MED ORDER — POLYETHYLENE GLYCOL 3350 17 G PO PACK
17.0000 g | PACK | Freq: Every day | ORAL | Status: DC
Start: 2015-12-18 — End: 2015-12-20
  Administered 2015-12-18 – 2015-12-20 (×2): 17 g via ORAL
  Filled 2015-12-18 (×2): qty 1

## 2015-12-18 NOTE — Progress Notes (Signed)
Having problems with Percocet not scanning into the mar...Marland Kitchen.Marland Kitchen.Had another RN witness me administer the medication just as a precaution...Marland Kitchen.Marland Kitchen.Contacted Pharmacy who came up and were unable to scan the medication as well.  Pharmacy will work on correcting this issue a.s.a.p.

## 2015-12-18 NOTE — Progress Notes (Signed)
Patient ID: Jeanne PandyLinda Laforest, female   DOB: 07-16-1981, 35 y.o.   MRN: [id number] Patient with left S1 sensory radiculopathy. DVT study was negative. Abdominal incision dry. Ct lumbar postop wnl. Seem orders

## 2015-12-18 NOTE — Progress Notes (Signed)
Physical Therapy Treatment Patient Details Name: Jeanne Hamilton MRN: [id number] DOB: 07/27/1981 Today's Date: 12/18/2015    History of Present Illness Pt is a 35 y.o. female s/p L5-S1 ALIF. No pertinent PMHx in chart.    PT Comments    Patient is progressing toward mobility but continues to be limited by L calf/ankle pain. Pt recalled 3/3 precautions and maintained throughout session. Continue to progress as tolerated.   Follow Up Recommendations  No PT follow up;Supervision - Intermittent     Equipment Recommendations  None recommended by PT    Recommendations for Other Services       Precautions / Restrictions Precautions Precautions: Back Precaution Booklet Issued: No Precaution Comments: Pt able to recall and maintain 3/3 back precautions. Required Braces or Orthoses: Spinal Brace Spinal Brace: Lumbar corset;Applied in sitting position Restrictions Weight Bearing Restrictions: No    Mobility  Bed Mobility               General bed mobility comments: pt on BSC upon arrival; mother present  Transfers Overall transfer level: Needs assistance Equipment used: None Transfers: Sit to/from Stand Sit to Stand: Supervision         General transfer comment: good technique demonstrated from [facility] and EOB; pt donned brace independently; supervision for safety  Ambulation/Gait Ambulation/Gait assistance: Min guard Ambulation Distance (Feet): 70 Feet Assistive device: Rolling walker (2 wheeled) Gait Pattern/deviations: Step-to pattern;Decreased step length - right;Decreased stance time - left;Decreased weight shift to left;Antalgic Gait velocity: decreased   General Gait Details: cues for posture and position of RW; pt ambulated on L forefoot due to pain with ability to heel strike for first ~20 ft   Stairs Stairs:  (deferred due to L LE pain)          Wheelchair Mobility    Modified Rankin (Stroke Patients Only)       Balance                                     Cognition Arousal/Alertness: Awake/alert Behavior During Therapy: WFL for tasks assessed/performed Overall Cognitive Status: Within Functional Limits for tasks assessed                      Exercises General Exercises - Lower Extremity Ankle Circles/Pumps: AROM;Left;20 reps;Seated    General Comments General comments (skin integrity, edema, etc.): encouraged pt to change positions often and ambulate with nursing staff throughout the day       Pertinent Vitals/Pain Pain Assessment: 0-10 Pain Score: 6  Pain Location: L calf/ankle Pain Descriptors / Indicators: Burning;Cramping;Grimacing;Guarding;Aching Pain Intervention(s): Limited activity within patient's tolerance;Monitored during session;Premedicated before session;Repositioned    Home Living                      Prior Function            PT Goals (current goals can now be found in the care plan section) Acute Rehab PT Goals Patient Stated Goal: home Progress towards PT goals: Progressing toward goals    Frequency  Min 5X/week    PT Plan Current plan remains appropriate    Co-evaluation             End of Session Equipment Utilized During Treatment: Gait belt;Back brace Activity Tolerance: Patient limited by pain Patient left: in chair;with call bell/phone within reach     Time: 1021-1040 PT Time  Calculation (min) (ACUTE ONLY): 19 min  Charges:  $Gait Training: 8-22 mins                    G Codes:      Derek MoundKellyn R Erikka Follmer Kikuye Korenek, PTA Pager: [phone]   12/18/2015, 10:48 AM

## 2015-12-19 NOTE — Progress Notes (Signed)
Patient ID: Jeanne PandyLinda Hamilton, female   DOB: [birth year], 35 y.o.   MRN: [id number] Sensory pain under better control  Continue iv decadron till am. Possible discharge in am. Pt to see

## 2015-12-19 NOTE — Progress Notes (Signed)
Physical Therapy Treatment Patient Details Name: Jeanne Hamilton MRN: [id number] DOB: 1980-08-26 Today's Date: 12/19/2015    History of Present Illness Pt is a 35 y.o. female s/p L5-S1 ALIF. No pertinent PMHx in chart.    PT Comments    Pain well-controlled and Jeanne Hamilton was able to tolerate more activity and walking; monitored L ankle/calf pain during amb, and the pain did incr;   We discussed the No Lifting precaution, and Jeanne Hamilton will be looking into getting help with her 2 kids (4 yo and 5 mos)   Follow Up Recommendations  No PT follow up;Supervision - Intermittent     Equipment Recommendations  None recommended by PT    Recommendations for Other Services       Precautions / Restrictions Precautions Precautions: Back Precaution Comments: Pt able to recall and maintain 3/3 back precautions. Required Braces or Orthoses: Spinal Brace Spinal Brace: Lumbar corset;Applied in sitting position    Mobility  Bed Mobility                  Transfers   Equipment used: None Transfers: Sit to/from Stand Sit to Stand: Supervision         General transfer comment: good technique demonstrated from recliner; pt donned brace independently; supervision for safety  Ambulation/Gait Ambulation/Gait assistance: Supervision Ambulation Distance (Feet): 130 Feet Assistive device: None Gait Pattern/deviations: Step-through pattern Gait velocity: decreased   General Gait Details: Cues to self-monitor for activity tolerance   Stairs Stairs: Yes Stairs assistance: Supervision Stair Management: One rail Right;Forwards;Alternating pattern Number of Stairs: 3 General stair comments: managing well  Wheelchair Mobility    Modified Rankin (Stroke Patients Only)       Balance                                    Cognition Arousal/Alertness: Awake/alert Behavior During Therapy: WFL for tasks assessed/performed Overall Cognitive Status: Within Functional Limits  for tasks assessed                      Exercises      General Comments        Pertinent Vitals/Pain Pain Assessment: 0-10 Pain Score: 3  Pain Location: L ankle, calf Pain Descriptors / Indicators: Aching Pain Intervention(s): Monitored during session    Home Living                      Prior Function            PT Goals (current goals can now be found in the care plan section) Acute Rehab PT Goals Patient Stated Goal: home PT Goal Formulation: With patient Time For Goal Achievement: 12/23/15 Potential to Achieve Goals: Good Progress towards PT goals: Progressing toward goals    Frequency  Min 5X/week    PT Plan Current plan remains appropriate    Co-evaluation             End of Session   Activity Tolerance: Patient tolerated treatment well Patient left: in chair;with call bell/phone within reach     Time: 4098-11911426-1444 PT Time Calculation (min) (ACUTE ONLY): 18 min  Charges:  $Gait Training: 8-22 mins                    G Codes:      Olen PelGarrigan, Tashauna Caisse Hamff 12/19/2015, 2:57 PM  Van ClinesHolly Daniah Zaldivar, PT  Acute Rehabilitation Services  Pager [phone] Office [phone]

## 2015-12-20 MED ORDER — TIZANIDINE HCL 4 MG PO TABS: 4.0000 mg | ORAL_TABLET | Freq: Four times a day (QID) | ORAL | Status: DC | PRN

## 2015-12-20 MED ORDER — GABAPENTIN 300 MG PO CAPS: 300.0000 mg | ORAL_CAPSULE | Freq: Three times a day (TID) | ORAL | Status: DC

## 2015-12-20 MED ORDER — OXYCODONE-ACETAMINOPHEN 5-325 MG PO TABS: 1.0000 | ORAL_TABLET | Freq: Four times a day (QID) | ORAL | Status: DC | PRN

## 2015-12-20 MED ORDER — METHYLPREDNISOLONE 4 MG PO TBPK: ORAL_TABLET | ORAL | Status: DC

## 2015-12-20 MED FILL — Heparin Sodium (Porcine) Inj 1000 Unit/ML: INTRAMUSCULAR | Qty: 30 | Status: AC

## 2015-12-20 MED FILL — Sodium Chloride IV Soln 0.9%: INTRAVENOUS | Qty: 1000 | Status: AC

## 2015-12-20 NOTE — Care Management Note (Signed)
Case Management Note  Patient Details  Name: Jeanne Hamilton MRN: [id number] Date of Birth: [birth year]  Subjective/Objective:                    Action/Plan: Pt discharging home with orders for DME. Jermaine with [facility] notified and will deliver the equipment to the room. Bedside RN updated.   Expected Discharge Date:  12/17/15               Expected Discharge Plan:  Home/Self Care  In-House Referral:     Discharge planning Services  CM Consult  Post Acute Care Choice:    Choice offered to:  Patient  DME Arranged:  3-N-1, Walker rolling DME Agency:  [facility]  HH Arranged:    HH Agency:     Status of Service:  Completed, signed off  Medicare Important Message Given:    Date Medicare IM Given:    Medicare IM give by:    Date Additional Medicare IM Given:    Additional Medicare Important Message give by:     If discussed at Long Length of Stay Meetings, dates discussed:    Additional Comments:  Kermit BaloKelli F Javiana Anwar, RN 12/20/2015, 11:12 AM

## 2015-12-20 NOTE — Discharge Instructions (Signed)

## 2015-12-20 NOTE — Progress Notes (Signed)
Physical Therapy Treatment Patient Details Name: Jeanne PandyLinda Bogdon MRN: [id number] DOB: [birth year] Today's Date: 12/20/2015    History of Present Illness Pt is a 35 y.o. female s/p L5-S1 ALIF. No pertinent PMHx in chart.    PT Comments    Patient progressing well toward PT goals. Tolerated gait training and stair training with supervision for safety. Continues to reports burning, nerve pain in left calf, ankle limiting mobility. Encouraged use of RW at home as needed for safety. Pt will also need a BSC due to having low toilet seats. Pt plans to discharge home today with support of family. Will follow acutely.    Follow Up Recommendations  No PT follow up;Supervision - Intermittent     Equipment Recommendations  Rolling walker with 5" wheels;3in1 (PT)    Recommendations for Other Services       Precautions / Restrictions Precautions Precautions: Back Precaution Booklet Issued: No Precaution Comments: Pt able to recall and maintain 3/3 back precautions. Required Braces or Orthoses: Spinal Brace Spinal Brace: Lumbar corset;Applied in sitting position Restrictions Weight Bearing Restrictions: No    Mobility  Bed Mobility Overal bed mobility: Modified Independent Bed Mobility: Rolling;Sidelying to Sit;Sit to Sidelying Rolling: Modified independent (Device/Increase time) Sidelying to sit: Modified independent (Device/Increase time)     Sit to sidelying: Modified independent (Device/Increase time) General bed mobility comments: Cues for log roll technique.  Transfers Overall transfer level: Needs assistance Equipment used: None Transfers: Sit to/from Stand Sit to Stand: Supervision         General transfer comment: Supervision for safety. Stood from [employer] x2.  Ambulation/Gait Ambulation/Gait assistance: Supervision Ambulation Distance (Feet): 275 Feet Assistive device: None Gait Pattern/deviations: Step-to pattern;Step-through pattern;Decreased stance time -  left Gait velocity: decreased Gait velocity interpretation: Below normal speed for age/gender General Gait Details: Decreased stance time LLE due to pain in left ankle/calf; did not need RW today but will need it on "not good days."   Stairs Stairs: Yes Stairs assistance: Supervision Stair Management: Alternating pattern;Two rails Number of Stairs: 3 (+ 2 x2 bouts) General stair comments: Good, safe technique. no difficulties.   Wheelchair Mobility    Modified Rankin (Stroke Patients Only)       Balance Overall balance assessment: Needs assistance Sitting-balance support: Feet supported;No upper extremity supported Sitting balance-Leahy Scale: Normal Sitting balance - Comments: Able to donn brace independently.   Standing balance support: During functional activity Standing balance-Leahy Scale: Good                      Cognition Arousal/Alertness: Awake/alert Behavior During Therapy: WFL for tasks assessed/performed Overall Cognitive Status: Within Functional Limits for tasks assessed                      Exercises      General Comments General comments (skin integrity, edema, etc.): Discussed limiting sitting position at home initially and moving every 45 mins-hour during the day.        Pertinent Vitals/Pain Pain Assessment: Faces Faces Pain Scale: Hurts even more Pain Location: left ankle, calf Pain Descriptors / Indicators: Burning Pain Intervention(s): Monitored during session;Premedicated before session    Home Living                      Prior Function            PT Goals (current goals can now be found in the care plan section) Progress towards PT goals: Progressing toward  goals    Frequency  Min 5X/week    PT Plan Current plan remains appropriate;Equipment recommendations need to be updated    Co-evaluation             End of Session Equipment Utilized During Treatment: Gait belt;Back brace Activity  Tolerance: Patient tolerated treatment well Patient left: in bed;with call bell/phone within reach;with bed alarm set     Time: 4098-1191 PT Time Calculation (min) (ACUTE ONLY): 20 min  Charges:  $Gait Training: 8-22 mins                    G Codes:      Kelechi Astarita A Gwenda Heiner 12/20/2015, 10:59 AM Mylo Red, PT, DPT [phone]

## 2015-12-20 NOTE — Discharge Summary (Signed)
  Physician Discharge Summary  Patient ID: Jeanne PandyLinda Hamilton MRN: [id number] DOB/AGE: June 26, 1981 35 y.o.  Admit date: 12/15/2015 Discharge date: 12/20/2015  Admission Diagnoses:lumbar degenerative disc disease L5/S1 Discharge Diagnoses:  Active Problems:   Disc disease, degenerative, lumbar or lumbosacral   Discharged Condition: good  Hospital Course: Ms. Jeanne Hamilton was admitted and taken to the operating room for an uncomplicated anterior lumbar fusion with Synfix hardware at L5/S1. Post op she has complained of pain in the left calf. She did not have a dvt, and a ct showed all of the hardware to be in good position.  Her wound is clean, dry, and without signs of infection. She is ambulating, voiding, and tolerating a regular diet.   Treatments: surgery: as above  Discharge Exam: Blood pressure 159/91, pulse 79, temperature 98.2 F (36.8 C), temperature source Oral, resp. rate 18, height 5\' 11"  (1.803 m), weight 100.699 kg (222 lb), SpO2 98 %, currently breastfeeding. General appearance: alert, cooperative, appears stated age and mild distress  Disposition: 01-Home or Self Care lumbar degenrative disc disease    Medication List    TAKE these medications        gabapentin 300 MG capsule  Commonly known as:  NEURONTIN  Take 1 capsule (300 mg total) by mouth 3 (three) times daily.     ibuprofen 200 MG tablet  Commonly known as:  ADVIL,MOTRIN  Take 800 mg by mouth every 6 (six) hours as needed (pain).     ibuprofen 600 MG tablet  Commonly known as:  ADVIL,MOTRIN  Take 1 tablet (600 mg total) by mouth every 6 (six) hours.     methylPREDNISolone 4 MG Tbpk tablet  Commonly known as:  MEDROL DOSEPAK  Take as directed     oxyCODONE-acetaminophen 5-325 MG tablet  Commonly known as:  ROXICET  Take 1-2 tablets by mouth every 6 (six) hours as needed for severe pain.     prenatal multivitamin Tabs tablet  Take 1 tablet by mouth daily.     sertraline 100 MG tablet  Commonly  known as:  ZOLOFT  Take 150 mg by mouth daily.     sodium chloride 0.65 % Soln nasal spray  Commonly known as:  OCEAN  Place 1 spray into both nostrils as needed for congestion.     tiZANidine 4 MG tablet  Commonly known as:  ZANAFLEX  Take 1 tablet (4 mg total) by mouth every 6 (six) hours as needed for muscle spasms.     traMADol 50 MG tablet  Commonly known as:  ULTRAM  Take 100 mg by mouth See admin instructions. Take 2 tablets (100 mg) by mouth every morning and may take an additional 2 tablets in the evening as needed for pain         Signed: Irving Lubbers L 12/20/2015, 10:45 AM

## 2015-12-20 NOTE — Progress Notes (Signed)
Volunteer here, and Mother of pt here to discharge pt home. Given pain medicine prior to discharge.

## 2015-12-20 NOTE — Care Management Note (Signed)
Case Management Note  Patient Details  Name: Reed PandyLinda Hypolite MRN: [id number] Date of Birth: [birth year]  Subjective/Objective:                    Action/Plan: Pt s/p ALIF and continues to c/o pain. CM following for d/c needs.  Expected Discharge Date:  12/17/15               Expected Discharge Plan:     In-House Referral:     Discharge planning Services     Post Acute Care Choice:    Choice offered to:     DME Arranged:    DME Agency:     HH Arranged:    HH Agency:     Status of Service:     Medicare Important Message Given:    Date Medicare IM Given:    Medicare IM give by:    Date Additional Medicare IM Given:    Additional Medicare Important Message give by:     If discussed at Long Length of Stay Meetings, dates discussed:    Additional Comments:  Kermit BaloKelli F Rashauna Tep, RN 12/20/2015, 10:43 AM

## 2016-03-28 ENCOUNTER — Emergency Department
Admission: EM | Admit: 2016-03-28 | Discharge: 2016-03-28 | Disposition: A | Discharge status: Home / Self Care | Payer: Medicaid Other | Attending: Student | Admitting: Student

## 2016-03-28 ENCOUNTER — Encounter: Payer: Self-pay | Admitting: Emergency Medicine

## 2016-03-28 DIAGNOSIS — Z87891 Personal history of nicotine dependence: Secondary | ICD-10-CM | POA: Diagnosis not present

## 2016-03-28 DIAGNOSIS — I10 Essential (primary) hypertension: Secondary | ICD-10-CM | POA: Insufficient documentation

## 2016-03-28 DIAGNOSIS — L989 Disorder of the skin and subcutaneous tissue, unspecified: Secondary | ICD-10-CM | POA: Diagnosis present

## 2016-03-28 DIAGNOSIS — L02213 Cutaneous abscess of chest wall: Secondary | ICD-10-CM | POA: Diagnosis not present

## 2016-03-28 MED ORDER — HYDROCODONE-ACETAMINOPHEN 5-325 MG PO TABS: 1.0000 | ORAL_TABLET | Freq: Four times a day (QID) | ORAL | 0 refills | Status: DC | PRN

## 2016-03-28 MED ORDER — LIDOCAINE-EPINEPHRINE (PF) 1 %-1:200000 IJ SOLN
10.0000 mL | Freq: Once | INTRAMUSCULAR | Status: AC
  Administered 2016-03-28: 10 mL
  Filled 2016-03-28: qty 30

## 2016-03-28 MED ORDER — IBUPROFEN 600 MG PO TABS: 600.0000 mg | ORAL_TABLET | Freq: Four times a day (QID) | ORAL | 0 refills | Status: DC | PRN

## 2016-03-28 MED ORDER — SULFAMETHOXAZOLE-TRIMETHOPRIM 800-160 MG PO TABS: 1.0000 | ORAL_TABLET | Freq: Two times a day (BID) | ORAL | 0 refills | Status: DC

## 2016-03-28 NOTE — ED Triage Notes (Signed)
Pt presents stating she had a "blackhead" on her chest that has grown and gotten painful in the last three days. Pt states that it is very painful when clothing touches it or if she brushes against it. Pt states that she has had it for a while and she tried popping it, releasing some foul-smelling pus. Pt alert & oriented with NAD noted.

## 2016-03-28 NOTE — Discharge Instructions (Signed)
Leave dressing on for 48 hours.
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
## 2016-04-04 DIAGNOSIS — F909 Attention-deficit hyperactivity disorder, unspecified type: Secondary | ICD-10-CM | POA: Insufficient documentation

## 2016-12-28 DIAGNOSIS — Z791 Long term (current) use of non-steroidal anti-inflammatories (NSAID): Secondary | ICD-10-CM | POA: Diagnosis not present

## 2016-12-28 DIAGNOSIS — M545 Low back pain: Secondary | ICD-10-CM | POA: Insufficient documentation

## 2016-12-28 DIAGNOSIS — Z79899 Other long term (current) drug therapy: Secondary | ICD-10-CM | POA: Diagnosis not present

## 2016-12-28 DIAGNOSIS — Z87891 Personal history of nicotine dependence: Secondary | ICD-10-CM | POA: Insufficient documentation

## 2016-12-28 DIAGNOSIS — I10 Essential (primary) hypertension: Secondary | ICD-10-CM | POA: Insufficient documentation

## 2016-12-29 ENCOUNTER — Emergency Department: Payer: Medicaid Other

## 2016-12-29 ENCOUNTER — Emergency Department
Admission: EM | Admit: 2016-12-29 | Discharge: 2016-12-29 | Disposition: A | Discharge status: Home / Self Care | Payer: Medicaid Other | Attending: Emergency Medicine | Admitting: Emergency Medicine

## 2016-12-29 ENCOUNTER — Encounter: Payer: Self-pay | Admitting: Emergency Medicine

## 2016-12-29 DIAGNOSIS — M545 Low back pain, unspecified: Secondary | ICD-10-CM

## 2016-12-29 LAB — POCT PREGNANCY, URINE: Preg Test, Ur: NEGATIVE

## 2016-12-29 MED ORDER — DIAZEPAM 2 MG PO TABS
2.0000 mg | ORAL_TABLET | Freq: Once | ORAL | Status: AC
  Administered 2016-12-29: 2 mg via ORAL
  Filled 2016-12-29: qty 1

## 2016-12-29 MED ORDER — OXYCODONE-ACETAMINOPHEN 5-325 MG PO TABS
1.0000 | ORAL_TABLET | Freq: Once | ORAL | Status: AC
  Administered 2016-12-29: 1 via ORAL
  Filled 2016-12-29: qty 1

## 2016-12-29 MED ORDER — KETOROLAC TROMETHAMINE 30 MG/ML IJ SOLN
60.0000 mg | Freq: Once | INTRAMUSCULAR | Status: AC
  Administered 2016-12-29: 60 mg via INTRAMUSCULAR
  Filled 2016-12-29: qty 2

## 2016-12-29 MED ORDER — IBUPROFEN 800 MG PO TABS: 800.0000 mg | ORAL_TABLET | Freq: Three times a day (TID) | ORAL | 0 refills | Status: DC | PRN

## 2016-12-29 MED ORDER — OXYCODONE-ACETAMINOPHEN 5-325 MG PO TABS: 1.0000 | ORAL_TABLET | ORAL | 0 refills | Status: DC | PRN

## 2016-12-29 MED ORDER — DIAZEPAM 2 MG PO TABS: 2.0000 mg | ORAL_TABLET | Freq: Three times a day (TID) | ORAL | 0 refills | Status: DC | PRN

## 2016-12-29 NOTE — ED Notes (Signed)
Pt to xray via stretcher accomp by xray tech 

## 2016-12-29 NOTE — ED Notes (Signed)
Patient discharged to home per MD order. Patient in stable condition, and deemed medically cleared by ED provider for discharge. Discharge instructions reviewed with patient/family using "Teach Back"; verbalized understanding of medication education and administration, and information about follow-up care. Denies further concerns. ° °

## 2016-12-29 NOTE — ED Notes (Signed)
Pt ambulatory to BR with no difficulty to void, qs

## 2016-12-29 NOTE — Discharge Instructions (Signed)
1. Take medicines as needed for pain and muscle spasms (Motrin/Percocet/Valium #15). 2. Apply moist heat to affected area several times daily. 3. Return to the ER for worsening symptoms, persistent vomiting, extremity weakness, losing control of bowel or bladder, or other concerns.

## 2016-12-29 NOTE — ED Triage Notes (Signed)
Pt comes into the ED via POV c/o lower back pain that started today related to no injury.  Patient states she woke up with the pain.  Patient unable to sit still in wheelchair and very tearful in triage.  Previous h/o of anterior lumbar fusion at the L5 and S1.  Patient states" I think something is wrong from where my surgery was".  Previous surgery was completed in May 2017.  Denies any numbness and tingling that is new, and denies any urinary symptoms at this time.

## 2016-12-29 NOTE — ED Notes (Signed)
Pt resting quietly on stretcher in exam room with no distress noted; pt reports mid lower back pain since this am with no known cause; pain worse when moving; fusion performed last year L5-S1; ibuprofen taken PTA without relief
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
## 2017-02-13 ENCOUNTER — Other Ambulatory Visit: Payer: Self-pay | Admitting: Neurosurgery

## 2017-02-13 DIAGNOSIS — M5137 Other intervertebral disc degeneration, lumbosacral region: Secondary | ICD-10-CM

## 2017-02-20 ENCOUNTER — Ambulatory Visit
Admission: RE | Admit: 2017-02-20 | Discharge: 2017-02-20 | Disposition: A | Discharge status: Home / Self Care | Payer: Medicaid Other | Source: Ambulatory Visit | Attending: Neurosurgery | Admitting: Neurosurgery

## 2017-02-20 DIAGNOSIS — M48061 Spinal stenosis, lumbar region without neurogenic claudication: Secondary | ICD-10-CM | POA: Diagnosis not present

## 2017-02-20 DIAGNOSIS — M5137 Other intervertebral disc degeneration, lumbosacral region: Secondary | ICD-10-CM

## 2017-02-20 DIAGNOSIS — Z9889 Other specified postprocedural states: Secondary | ICD-10-CM | POA: Insufficient documentation

## 2017-02-20 DIAGNOSIS — Z981 Arthrodesis status: Secondary | ICD-10-CM | POA: Diagnosis not present

## 2017-02-20 DIAGNOSIS — M5136 Other intervertebral disc degeneration, lumbar region: Secondary | ICD-10-CM | POA: Diagnosis not present

## 2017-02-20 LAB — POCT I-STAT CREATININE: CREATININE: 0.7 mg/dL (ref 0.44–1.00)

## 2017-02-20 MED ORDER — GADOBENATE DIMEGLUMINE 529 MG/ML IV SOLN
20.0000 mL | Freq: Once | INTRAVENOUS | Status: AC | PRN
  Administered 2017-02-20: 19 mL via INTRAVENOUS

## 2017-07-31 NOTE — L&D Delivery Note (Signed)
Delivery Note At 3:40 PM a viable female was delivered via Vaginal, Spontaneous (Presentation: VTX;  ).  APGAR: 9, 10; weight  unassigned.   Placenta status: intact . + true knot loose noted in cord, .  Cord:  with the following complications:none .  Cord pH:n/a Anesthesia:  CLE Episiotomy: None Lacerations: none  Suture Repair: n/a Est. Blood Loss (mL):  300cc Uncomplicated svd . Short second stage . 2 pushes . ROA . Delivery of shoulder and body easily . Delayed cord clamping  Mom to postpartum.  Baby to Couplet care / Skin to Skin.  Jeanne Austinhomas J Hamilton 07/13/2018, 4:04 PM

## 2017-08-28 IMAGING — CR DG OR LOCAL ABDOMEN
1 series · 1 of 1 positions shown · non-contrast
Comparison: None.

CLINICAL DATA: Intraoperative lumbar spine fusion. Abnormal
instrument count.

EXAM:
OR LOCAL ABDOMEN

[supine ap]
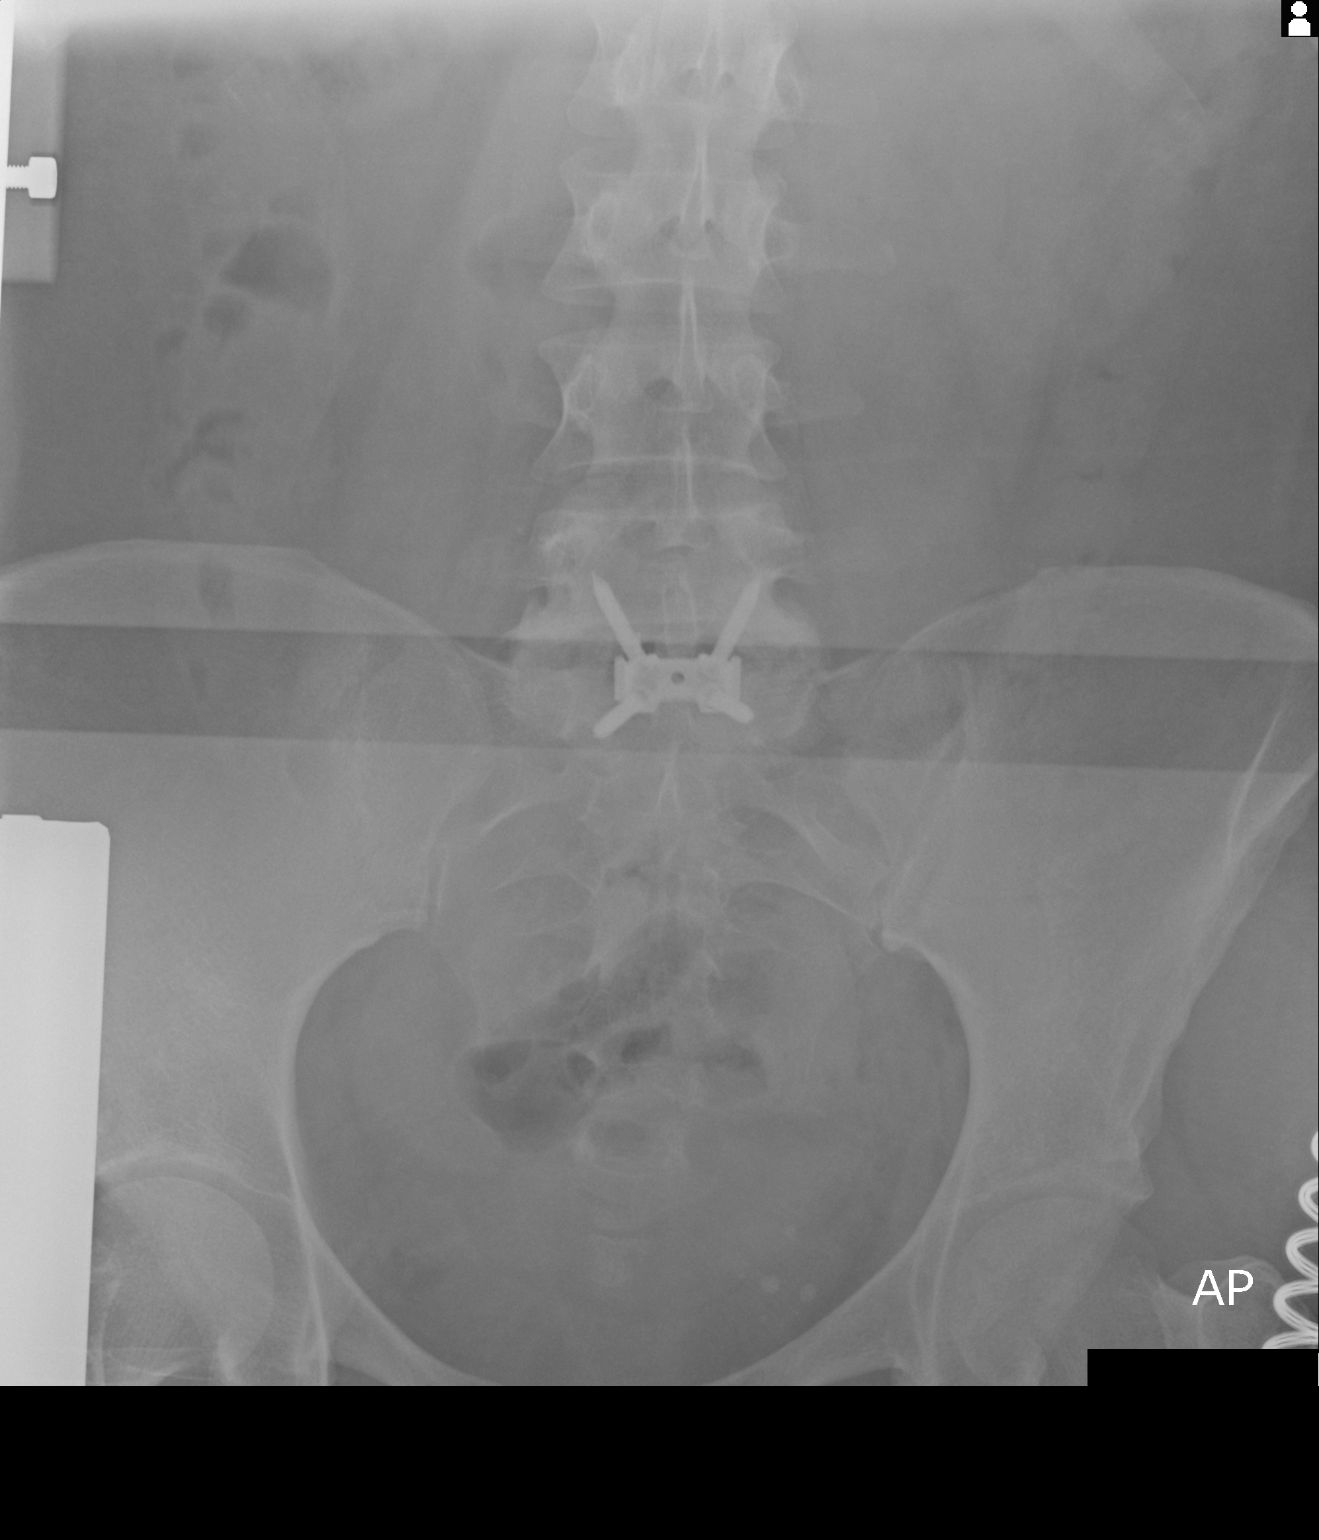

[1 of 1 positions shown; findings below may reference images not displayed]

FINDINGS: Anterior lumbar interbody fusion hardware is seen at L5-S1. No
surgical instruments are other radiopaque foreign body identified in
the visualized portion of the abdomen or pelvis.
IMPRESSION: ALIF at L5-S1. No retained surgical instrument or other radiopaque
foreign body visualized.

These results were called by telephone at the time of interpretation
acknowledged these results.

## 2017-08-28 IMAGING — RF DG C-ARM 61-120 MIN
1 series · 2 of 2 positions shown · non-contrast
Comparison: [DATE]

CLINICAL DATA: L5-S1 fusion

EXAM:
DG C-ARM 61-120 MIN; LUMBAR SPINE - 2-3 VIEW

[Series 1: run · 2 of 2 slices shown]
[im 1/2]
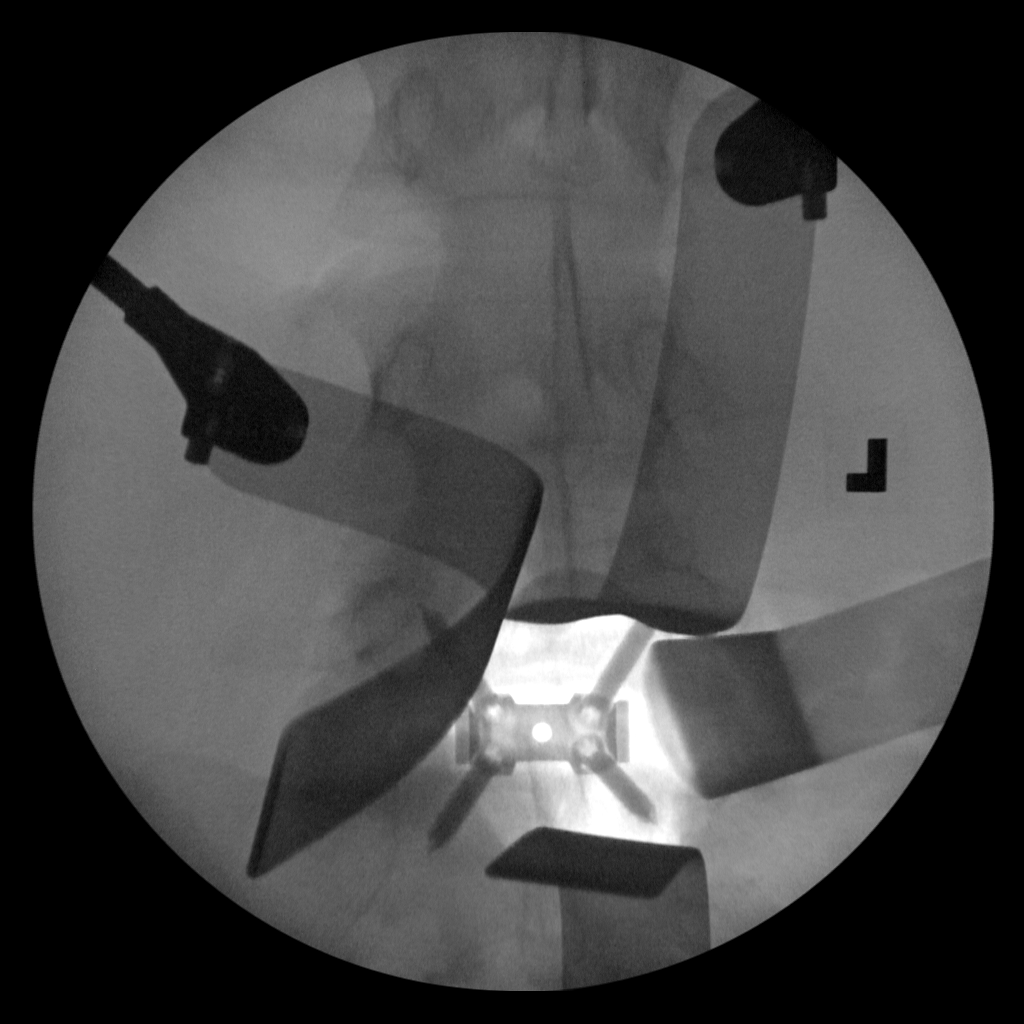
[im 2/2]
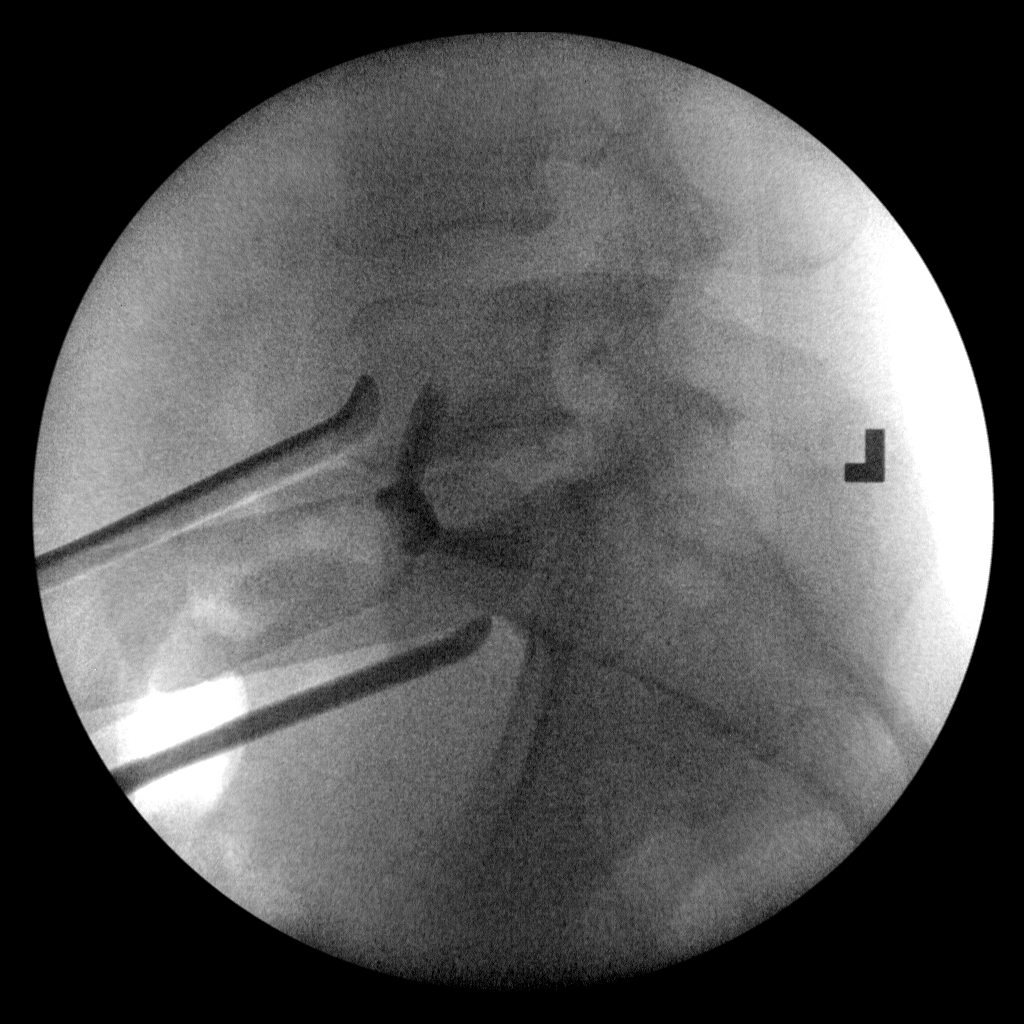

[2 of 2 positions shown; findings below may reference images not displayed]

FINDINGS: Two views of the lumbar spine submitted. There is anterior metallic
fusion with metallic plate and oblique orientated screws at L5-S1
level. There is anatomic alignment.
IMPRESSION: Anterior fusion at L5-S1 level with alignment preserved.

Fluoroscopy time 40 seconds.  Please see the operative report.

## 2017-09-14 ENCOUNTER — Encounter: Payer: Self-pay | Admitting: Emergency Medicine

## 2017-09-14 ENCOUNTER — Emergency Department: Payer: Medicaid Other

## 2017-09-14 ENCOUNTER — Emergency Department
Admission: EM | Admit: 2017-09-14 | Discharge: 2017-09-14 | Disposition: A | Discharge status: Home / Self Care | Payer: Medicaid Other | Attending: Emergency Medicine | Admitting: Emergency Medicine

## 2017-09-14 DIAGNOSIS — Z79899 Other long term (current) drug therapy: Secondary | ICD-10-CM | POA: Diagnosis not present

## 2017-09-14 DIAGNOSIS — I10 Essential (primary) hypertension: Secondary | ICD-10-CM | POA: Insufficient documentation

## 2017-09-14 DIAGNOSIS — R51 Headache: Secondary | ICD-10-CM | POA: Insufficient documentation

## 2017-09-14 DIAGNOSIS — R531 Weakness: Secondary | ICD-10-CM | POA: Diagnosis not present

## 2017-09-14 DIAGNOSIS — H538 Other visual disturbances: Secondary | ICD-10-CM | POA: Diagnosis not present

## 2017-09-14 DIAGNOSIS — H539 Unspecified visual disturbance: Secondary | ICD-10-CM

## 2017-09-14 DIAGNOSIS — R519 Headache, unspecified: Secondary | ICD-10-CM

## 2017-09-14 DIAGNOSIS — Z87891 Personal history of nicotine dependence: Secondary | ICD-10-CM | POA: Insufficient documentation

## 2017-09-14 LAB — CBC
HCT: 36.7 % (ref 35.0–47.0)
HEMOGLOBIN: 12.1 g/dL (ref 12.0–16.0)
MCH: 26.8 pg (ref 26.0–34.0)
MCHC: 32.9 g/dL (ref 32.0–36.0)
MCV: 81.4 fL (ref 80.0–100.0)
Platelets: 162 10*3/uL (ref 150–440)
RBC: 4.51 MIL/uL (ref 3.80–5.20)
RDW: 14.8 % — ABNORMAL HIGH (ref 11.5–14.5)
WBC: 5.8 10*3/uL (ref 3.6–11.0)

## 2017-09-14 LAB — URINE DRUG SCREEN, QUALITATIVE (ARMC ONLY)
Amphetamines, Ur Screen: POSITIVE — AB
BARBITURATES, UR SCREEN: NOT DETECTED
Benzodiazepine, Ur Scrn: NOT DETECTED
COCAINE METABOLITE, UR ~~LOC~~: NOT DETECTED
Cannabinoid 50 Ng, Ur ~~LOC~~: POSITIVE — AB
MDMA (ECSTASY) UR SCREEN: NOT DETECTED
Methadone Scn, Ur: NOT DETECTED
Opiate, Ur Screen: NOT DETECTED
PHENCYCLIDINE (PCP) UR S: NOT DETECTED
TRICYCLIC, UR SCREEN: NOT DETECTED

## 2017-09-14 LAB — DIFFERENTIAL
Basophils Absolute: 0 10*3/uL (ref 0–0.1)
Basophils Relative: 1 %
EOS ABS: 0.1 10*3/uL (ref 0–0.7)
Eosinophils Relative: 1 %
LYMPHS ABS: 1.2 10*3/uL (ref 1.0–3.6)
LYMPHS PCT: 21 %
MONO ABS: 0.5 10*3/uL (ref 0.2–0.9)
Monocytes Relative: 10 %
NEUTROS PCT: 67 %
Neutro Abs: 3.8 10*3/uL (ref 1.4–6.5)

## 2017-09-14 LAB — BASIC METABOLIC PANEL
ANION GAP: 9 (ref 5–15)
BUN: 10 mg/dL (ref 6–20)
CALCIUM: 9.6 mg/dL (ref 8.9–10.3)
CO2: 28 mmol/L (ref 22–32)
CREATININE: 0.7 mg/dL (ref 0.44–1.00)
Chloride: 105 mmol/L (ref 101–111)
GFR calc Af Amer: >60 mL/min (ref >60)
GLUCOSE: 112 mg/dL — AB (ref 65–99)
Potassium: 3.8 mmol/L (ref 3.5–5.1)
Sodium: 142 mmol/L (ref 135–145)

## 2017-09-14 LAB — PROTIME-INR
INR: 1.05
PROTHROMBIN TIME: 13.6 s (ref 11.4–15.2)

## 2017-09-14 LAB — GLUCOSE, CAPILLARY: GLUCOSE-CAPILLARY: 106 mg/dL — AB (ref 65–99)

## 2017-09-14 LAB — POCT PREGNANCY, URINE: Preg Test, Ur: NEGATIVE

## 2017-09-14 LAB — APTT: APTT: 31 s (ref 24–36)

## 2017-09-14 LAB — TROPONIN I

## 2017-09-14 MED ORDER — TETRACAINE HCL 0.5 % OP SOLN
OPHTHALMIC | Status: AC
  Filled 2017-09-14: qty 4

## 2017-09-14 MED ORDER — ASPIRIN 81 MG PO CHEW
81.0000 mg | CHEWABLE_TABLET | Freq: Once | ORAL | Status: AC
  Administered 2017-09-14: 81 mg via ORAL
  Filled 2017-09-14: qty 1

## 2017-09-14 MED ORDER — ASPIRIN EC 81 MG PO TBEC: 81.0000 mg | DELAYED_RELEASE_TABLET | Freq: Every day | ORAL | 0 refills | Status: DC

## 2017-09-14 NOTE — ED Notes (Signed)
CODE Stroke initiated
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
## 2017-09-14 NOTE — ED Notes (Signed)
Patient transported to CT. Arrived, 0723 in CT

## 2017-09-14 NOTE — ED Triage Notes (Signed)
Pt to triage via w/c, brought in by EMS; per EMS, pt c/o pain behind right eye with "black spot"

## 2017-09-14 NOTE — ED Notes (Signed)
Neurologist at bedside. 

## 2017-09-14 NOTE — ED Notes (Signed)
Patient transported to MRI 
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
## 2017-09-14 NOTE — ED Triage Notes (Signed)
Patient brought in by ems. Patient with complaint of pain behind her right eye that started tonight about 01:00 this morning. Patient states that around 03:00 she had vision changes to her right eye that has since resolved. Patient states that she check her blood pressure and it was elevated. Per Ems bp was 190/110,

## 2017-09-14 NOTE — ED Notes (Signed)
Pt alert and oriented X4, active, cooperative, pt in NAD. RR even and unlabored, color WNL.  Pt informed to return if any life threatening symptoms occur.  Discharge and followup instructions reviewed. Pt ambulates safely and independently. Offered wheelchair, denies. Boyfriend picking up patient.

## 2017-09-14 NOTE — ED Notes (Signed)
CODE SEPSIS initiated

## 2017-09-14 NOTE — Progress Notes (Signed)
   09/14/17 0720  Clinical Encounter Type  Visited With Patient not available  Visit Type Initial  Referral From Nurse  Consult/Referral To Chaplain  Spiritual Encounters  Spiritual Needs Prayer   CH received a Code Stroke for PT. PT is currently in CT. CH will follow up upon her return.
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
## 2017-09-18 DIAGNOSIS — G839 Paralytic syndrome, unspecified: Secondary | ICD-10-CM | POA: Insufficient documentation

## 2017-09-18 DIAGNOSIS — G459 Transient cerebral ischemic attack, unspecified: Secondary | ICD-10-CM | POA: Insufficient documentation

## 2017-09-27 DIAGNOSIS — R519 Headache, unspecified: Secondary | ICD-10-CM | POA: Insufficient documentation

## 2017-09-27 DIAGNOSIS — R531 Weakness: Secondary | ICD-10-CM | POA: Insufficient documentation

## 2017-10-03 DIAGNOSIS — G5601 Carpal tunnel syndrome, right upper limb: Secondary | ICD-10-CM | POA: Insufficient documentation

## 2017-11-20 DIAGNOSIS — F3341 Major depressive disorder, recurrent, in partial remission: Secondary | ICD-10-CM | POA: Insufficient documentation

## 2017-11-20 DIAGNOSIS — G8929 Other chronic pain: Secondary | ICD-10-CM | POA: Insufficient documentation

## 2017-11-29 LAB — OB RESULTS CONSOLE VARICELLA ZOSTER ANTIBODY, IGG: Varicella: IMMUNE

## 2017-11-29 LAB — OB RESULTS CONSOLE HEPATITIS B SURFACE ANTIGEN: Hepatitis B Surface Ag: NEGATIVE

## 2017-11-29 LAB — OB RESULTS CONSOLE RPR: RPR: NONREACTIVE

## 2017-11-29 LAB — OB RESULTS CONSOLE HIV ANTIBODY (ROUTINE TESTING): HIV: NONREACTIVE

## 2017-11-29 LAB — OB RESULTS CONSOLE RUBELLA ANTIBODY, IGM: Rubella: IMMUNE

## 2017-12-04 ENCOUNTER — Other Ambulatory Visit: Payer: Self-pay | Admitting: Certified Nurse Midwife

## 2017-12-04 DIAGNOSIS — Z369 Encounter for antenatal screening, unspecified: Secondary | ICD-10-CM

## 2017-12-10 ENCOUNTER — Other Ambulatory Visit: Payer: Self-pay | Admitting: Certified Nurse Midwife

## 2017-12-10 ENCOUNTER — Encounter: Payer: Self-pay | Admitting: *Deleted

## 2017-12-10 ENCOUNTER — Ambulatory Visit
Admission: RE | Admit: 2017-12-10 | Discharge: 2017-12-10 | Disposition: A | Discharge status: Home / Self Care | Payer: Medicaid Other | Source: Ambulatory Visit | Attending: Maternal and Fetal Medicine | Admitting: Maternal and Fetal Medicine

## 2017-12-10 VITALS — BP 126/88 | HR 85 | Temp 97.7°F | Resp 18 | Ht 71.0 in | Wt 209.0 lb

## 2017-12-10 DIAGNOSIS — Z369 Encounter for antenatal screening, unspecified: Secondary | ICD-10-CM

## 2017-12-10 DIAGNOSIS — O09521 Supervision of elderly multigravida, first trimester: Secondary | ICD-10-CM | POA: Diagnosis not present

## 2017-12-10 DIAGNOSIS — Z3689 Encounter for other specified antenatal screening: Secondary | ICD-10-CM | POA: Insufficient documentation

## 2017-12-10 DIAGNOSIS — Z3A08 8 weeks gestation of pregnancy: Secondary | ICD-10-CM | POA: Insufficient documentation

## 2017-12-10 NOTE — Progress Notes (Signed)
Referring Provider:  [facility] Length of Consultation: 30 minutes  Jeanne Hamilton was referred to [facility] [place] for genetic counseling because of advanced maternal age.  The patient will be 37 years old at the time of delivery.  This note summarizes the information we discussed.    We explained that the chance of a chromosome abnormality increases with maternal age.  Chromosomes and examples of chromosome problems were reviewed.  Humans typically have 46 chromosomes in each cell, with half passed through each sperm and egg.  Any change in the number or structure of chromosomes can increase the risk of problems in the physical and mental development of a pregnancy.   Based upon age of the patient, the chance of any chromosome abnormality was 1 in 46. The chance of Down syndrome, the most common chromosome problem associated with maternal age, was 1 in 24.  The risk of chromosome problems is in addition to the 3% general population risk for birth defects and mental retardation.  The greatest chance, of course, is that the baby would be born in good health.  We discussed the following prenatal screening and testing options for this pregnancy:  First trimester screening, which includes nuchal translucency ultrasound screen and first trimester maternal serum marker screening.  The nuchal translucency has approximately an 80% detection rate for Down syndrome and can be positive for other chromosome abnormalities as well as heart defects.  When combined with a maternal serum marker screening, the detection rate is up to 90% for Down syndrome and up to 97% for trisomy 18.     The chorionic villus sampling procedure is available for first trimester chromosome analysis.  This involves the withdrawal of a small amount of chorionic villi (tissue from the developing placenta).  Risk of pregnancy loss is estimated to be approximately 1 in 200 to 1 in 100 (0.5 to 1%).  There  is approximately a 1% (1 in 100) chance that the CVS chromosome results will be unclear.  Chorionic villi cannot be tested for neural tube defects.     Maternal serum marker screening, a blood test that measures pregnancy proteins, can provide risk assessments for Down syndrome, trisomy 18, and open neural tube defects (spina bifida, anencephaly). Because it does not directly examine the fetus, it cannot positively diagnose or rule out these problems.  Targeted ultrasound uses high frequency sound waves to create an image of the developing fetus.  An ultrasound is often recommended as a routine means of evaluating the pregnancy.  It is also used to screen for fetal anatomy problems (for example, a heart defect) that might be suggestive of a chromosomal or other abnormality.   Amniocentesis involves the removal of a small amount of amniotic fluid from the sac surrounding the fetus with the use of a thin needle inserted through the maternal abdomen and uterus.  Ultrasound guidance is used throughout the procedure.  Fetal cells from amniotic fluid are directly evaluated and > 99.5% of chromosome problems and > 98% of open neural tube defects can be detected. This procedure is generally performed after the 15th week of pregnancy.  The main risks to this procedure include complications leading to miscarriage in less than 1 in 200 cases (0.5%).  We also reviewed the availability of cell free fetal DNA testing from maternal blood to determine whether or not the baby may have either Down syndrome, trisomy 32, or trisomy 20.  This test utilizes a maternal blood sample and DNA  sequencing technology to isolate circulating cell free fetal DNA from maternal plasma.  The fetal DNA can then be analyzed for DNA sequences that are derived from the three most common chromosomes involved in aneuploidy, chromosomes 13, 18, and 21.  If the overall amount of DNA is greater than the expected level for any of these chromosomes,  aneuploidy is suspected.  While we do not consider it a replacement for invasive testing and karyotype analysis, a negative result from this testing would be reassuring, though not a guarantee of a normal chromosome complement for the baby.  An abnormal result is certainly suggestive of an abnormal chromosome complement, though we would still recommend CVS or amniocentesis to confirm any findings from this testing.  Cystic Fibrosis and Spinal Muscular Atrophy (SMA) screening were also discussed with the patient. Both conditions are recessive, which means that both parents must be carriers in order to have a child with the disease.  Cystic fibrosis (CF) is one of the most common genetic conditions in persons of Caucasian ancestry.  This condition occurs in approximately 1 in 2,500 Caucasian persons and results in thickened secretions in the lungs, digestive, and reproductive systems.  For a baby to be at risk for having CF, both of the parents must be carriers for this condition.  Approximately 1 in 49 Caucasian persons is a carrier for CF.  Current carrier testing looks for the most common mutations in the gene for CF and can detect approximately 90% of carriers in the Caucasian population.  This means that the carrier screening can greatly reduce, but cannot eliminate, the chance for an individual to have a child with CF.  If an individual is found to be a carrier for CF, then carrier testing would be available for the partner. As part of [place]'s newborn screening profile, all babies born in the state of [place] will have a two-tier screening process.  Specimens are first tested to determine the concentration of immunoreactive trypsinogen (IRT).  The top 5% of specimens with the highest IRT values then undergo DNA testing using a panel of over 40 common CF mutations. SMA is a neurodegenerative disorder that leads to atrophy of skeletal muscle and overall weakness.  This condition is also more  prevalent in the Caucasian population, with 1 in 40-1 in 60 persons being a carrier and 1 in 6,000-1 in 10,000 children being affected.  There are multiple forms of the disease, with some causing death in infancy to other forms with survival into adulthood.  The genetics of SMA is complex, but carrier screening can detect up to 95% of carriers in the Caucasian population.  Similar to CF, a negative result can greatly reduce, but cannot eliminate, the chance to have a child with SMA.  We reviewed the detailed family history and pregnancy history obtained at her last genetic counseling visit in 2016.  She reported no changes in the family other than the birth of their son who is now 17 years old and in good health.  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  This would be expected to have been a nondisjunction event that would have a recurrence chance of 1% of her age related risk, whichever is higher.  At age 77 years, her age related chance is higher.  In the current pregnancy, Ms. Abelson was taking Zoloft, Abilify and Tramadol.  However, she stopped all of her medications at when  she learned that she was pregnant in early May at what she thought was [redacted] weeks gestation.  However, by today's ultrasound, she is currently 8 weeks, 2 days pregnant and therefore stopped the medications at approximately 6-7 weeks.  None of these medications is expected to increase the risk for birth defects when taken as directed in pregnancy.  There is an association with Zoloft and a mild transient neonatal syndrome when taken in the third trimester.  She was also exposed to a CT scan due to possible TIA on February 15, which we now know was prior to conception.    After consideration of the options, Ms. Alonge desired MaterniT21 PLUS with SCA, however, given the early gestational age, she will return to clinic in 4 weeks for a follow up ultrasound and lab  work.  An ultrasound was performed at the time of the visit.  The gestational age was consistent with  8 weeks, 2 days.  Fetal anatomy could not be assessed due to early gestational age.  Please refer to the ultrasound report for details of that study.  Ms. Nakajima was encouraged to call with questions or concerns.  We can be contacted at [phone].   Tests Ordered: none - to be ordered at return visit   Cherly Anderson, MS, CGC

## 2017-12-11 ENCOUNTER — Ambulatory Visit: Payer: Medicaid Other | Attending: Orthopedic Surgery | Admitting: Occupational Therapy

## 2018-01-07 ENCOUNTER — Other Ambulatory Visit: Payer: Self-pay | Admitting: Obstetrics and Gynecology

## 2018-01-07 ENCOUNTER — Ambulatory Visit
Admission: RE | Admit: 2018-01-07 | Discharge: 2018-01-07 | Disposition: A | Discharge status: Home / Self Care | Payer: Medicaid Other | Source: Ambulatory Visit | Attending: Obstetrics and Gynecology | Admitting: Obstetrics and Gynecology

## 2018-01-07 DIAGNOSIS — Z3A12 12 weeks gestation of pregnancy: Secondary | ICD-10-CM | POA: Insufficient documentation

## 2018-01-07 DIAGNOSIS — Z369 Encounter for antenatal screening, unspecified: Secondary | ICD-10-CM

## 2018-01-11 LAB — MATERNIT21 PLUS CORE+SCA
CHROMOSOME 18: NEGATIVE
Chromosome 13: NEGATIVE
Chromosome 21: NEGATIVE
Y Chromosome: DETECTED

## 2018-01-21 ENCOUNTER — Telehealth: Payer: Self-pay | Admitting: Genetics

## 2018-01-21 ENCOUNTER — Other Ambulatory Visit: Payer: Self-pay | Admitting: *Deleted

## 2018-01-21 DIAGNOSIS — Z0489 Encounter for examination and observation for other specified reasons: Secondary | ICD-10-CM

## 2018-01-21 DIAGNOSIS — IMO0002 Reserved for concepts with insufficient information to code with codable children: Secondary | ICD-10-CM

## 2018-01-21 DIAGNOSIS — O09522 Supervision of elderly multigravida, second trimester: Secondary | ICD-10-CM

## 2018-01-21 NOTE — Telephone Encounter (Signed)
The patient was informed of the results of her recent MaterniT21 testing which yielded NEGATIVE results. 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 The sensitivity for trisomy 21, trisomy 18 and trisomy 13 using this testing are reported as 99.1%, 99.9% and 91.7% respectively. Thus, while the results of this testing are highly accurate, they are not considered diagnostic at this time. Should more definitive information be desired, the patient may still consider amniocentesis.   As requested to know by the patient, sex chromosome analysis was included for this sample. 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 This is predicted with >99% accuracy.  A maternal serum AFP only should be considered if screening for neural tube defects is desired.  We may be reached at [phone] with any questions or concerns.   Jawaun Celmer, MS, CGC  

## 2018-01-24 ENCOUNTER — Ambulatory Visit
Admission: RE | Admit: 2018-01-24 | Discharge: 2018-01-24 | Disposition: A | Discharge status: Home / Self Care | Payer: Medicaid Other | Source: Ambulatory Visit | Attending: Maternal & Fetal Medicine | Admitting: Maternal & Fetal Medicine

## 2018-01-24 DIAGNOSIS — O09522 Supervision of elderly multigravida, second trimester: Secondary | ICD-10-CM

## 2018-01-24 DIAGNOSIS — Z3A14 14 weeks gestation of pregnancy: Secondary | ICD-10-CM | POA: Insufficient documentation

## 2018-01-24 DIAGNOSIS — Z0489 Encounter for examination and observation for other specified reasons: Secondary | ICD-10-CM

## 2018-01-24 DIAGNOSIS — IMO0002 Reserved for concepts with insufficient information to code with codable children: Secondary | ICD-10-CM

## 2018-01-24 DIAGNOSIS — Z362 Encounter for other antenatal screening follow-up: Secondary | ICD-10-CM | POA: Insufficient documentation

## 2018-02-18 ENCOUNTER — Other Ambulatory Visit: Payer: Self-pay | Admitting: *Deleted

## 2018-02-18 DIAGNOSIS — O09522 Supervision of elderly multigravida, second trimester: Secondary | ICD-10-CM

## 2018-02-21 ENCOUNTER — Ambulatory Visit
Admission: RE | Admit: 2018-02-21 | Discharge: 2018-02-21 | Disposition: A | Discharge status: Home / Self Care | Payer: Medicaid Other | Source: Ambulatory Visit | Attending: Obstetrics and Gynecology | Admitting: Obstetrics and Gynecology

## 2018-02-21 VITALS — BP 123/82 | HR 76 | Temp 98.0°F | Resp 18 | Wt 215.0 lb

## 2018-02-21 DIAGNOSIS — O09522 Supervision of elderly multigravida, second trimester: Secondary | ICD-10-CM | POA: Insufficient documentation

## 2018-02-21 DIAGNOSIS — O09892 Supervision of other high risk pregnancies, second trimester: Secondary | ICD-10-CM | POA: Insufficient documentation

## 2018-02-21 DIAGNOSIS — Z3A18 18 weeks gestation of pregnancy: Secondary | ICD-10-CM | POA: Insufficient documentation

## 2018-02-21 DIAGNOSIS — O09899 Supervision of other high risk pregnancies, unspecified trimester: Secondary | ICD-10-CM

## 2018-03-18 ENCOUNTER — Other Ambulatory Visit: Payer: Self-pay | Admitting: Maternal and Fetal Medicine

## 2018-03-18 DIAGNOSIS — O09522 Supervision of elderly multigravida, second trimester: Secondary | ICD-10-CM

## 2018-03-18 DIAGNOSIS — O09899 Supervision of other high risk pregnancies, unspecified trimester: Secondary | ICD-10-CM

## 2018-03-21 ENCOUNTER — Ambulatory Visit
Admission: RE | Admit: 2018-03-21 | Discharge: 2018-03-21 | Disposition: A | Discharge status: Home / Self Care | Payer: Medicaid Other | Source: Ambulatory Visit | Attending: Maternal and Fetal Medicine | Admitting: Maternal and Fetal Medicine

## 2018-03-21 VITALS — BP 126/77 | HR 79 | Temp 98.2°F | Resp 18 | Wt 215.4 lb

## 2018-03-21 DIAGNOSIS — Z3A22 22 weeks gestation of pregnancy: Secondary | ICD-10-CM | POA: Insufficient documentation

## 2018-03-21 DIAGNOSIS — O09899 Supervision of other high risk pregnancies, unspecified trimester: Secondary | ICD-10-CM

## 2018-03-21 DIAGNOSIS — O09522 Supervision of elderly multigravida, second trimester: Secondary | ICD-10-CM | POA: Diagnosis present

## 2018-03-21 DIAGNOSIS — O321XX0 Maternal care for breech presentation, not applicable or unspecified: Secondary | ICD-10-CM | POA: Insufficient documentation

## 2018-03-21 DIAGNOSIS — Q270 Congenital absence and hypoplasia of umbilical artery: Secondary | ICD-10-CM

## 2018-04-15 ENCOUNTER — Other Ambulatory Visit: Payer: Self-pay

## 2018-04-15 DIAGNOSIS — O09522 Supervision of elderly multigravida, second trimester: Secondary | ICD-10-CM

## 2018-04-18 ENCOUNTER — Ambulatory Visit
Admission: RE | Admit: 2018-04-18 | Discharge: 2018-04-18 | Disposition: A | Discharge status: Home / Self Care | Payer: Medicaid Other | Source: Ambulatory Visit | Attending: Obstetrics and Gynecology | Admitting: Obstetrics and Gynecology

## 2018-04-18 ENCOUNTER — Other Ambulatory Visit: Payer: Medicaid Other

## 2018-04-18 ENCOUNTER — Encounter
Admission: RE | Admit: 2018-04-18 | Discharge: 2018-04-18 | Disposition: A | Discharge status: Home / Self Care | Payer: Medicaid Other | Source: Ambulatory Visit | Attending: Anesthesiology | Admitting: Anesthesiology

## 2018-04-18 VITALS — BP 137/78 | HR 75 | Temp 97.9°F | Resp 18 | Wt 222.2 lb

## 2018-04-18 DIAGNOSIS — Q270 Congenital absence and hypoplasia of umbilical artery: Secondary | ICD-10-CM

## 2018-04-18 DIAGNOSIS — Z3A26 26 weeks gestation of pregnancy: Secondary | ICD-10-CM | POA: Insufficient documentation

## 2018-04-18 DIAGNOSIS — O09892 Supervision of other high risk pregnancies, second trimester: Secondary | ICD-10-CM | POA: Insufficient documentation

## 2018-04-18 DIAGNOSIS — O09522 Supervision of elderly multigravida, second trimester: Secondary | ICD-10-CM | POA: Insufficient documentation

## 2018-04-18 DIAGNOSIS — O09523 Supervision of elderly multigravida, third trimester: Secondary | ICD-10-CM

## 2018-04-18 NOTE — Anesthesia Pain Management Evaluation Note (Signed)
Pt seen in preop consultation and should be a candidate for an epidural with previous lateral approach  back surgery.  Risks and benefits of epidural reviewed in additon to failed block and Hot spot secondary to scarring.  JA

## 2018-05-16 ENCOUNTER — Ambulatory Visit
Admission: RE | Admit: 2018-05-16 | Discharge: 2018-05-16 | Disposition: A | Discharge status: Home / Self Care | Payer: Medicaid Other | Source: Ambulatory Visit | Attending: Maternal & Fetal Medicine | Admitting: Maternal & Fetal Medicine

## 2018-05-16 DIAGNOSIS — Q270 Congenital absence and hypoplasia of umbilical artery: Secondary | ICD-10-CM | POA: Diagnosis not present

## 2018-05-16 DIAGNOSIS — O09523 Supervision of elderly multigravida, third trimester: Secondary | ICD-10-CM | POA: Insufficient documentation

## 2018-05-16 DIAGNOSIS — Z3A30 30 weeks gestation of pregnancy: Secondary | ICD-10-CM | POA: Insufficient documentation

## 2018-06-10 ENCOUNTER — Other Ambulatory Visit: Payer: Self-pay

## 2018-06-10 DIAGNOSIS — O09522 Supervision of elderly multigravida, second trimester: Secondary | ICD-10-CM

## 2018-06-13 ENCOUNTER — Ambulatory Visit
Admission: RE | Admit: 2018-06-13 | Discharge: 2018-06-13 | Disposition: A | Discharge status: Home / Self Care | Payer: Medicaid Other | Source: Ambulatory Visit | Attending: Obstetrics and Gynecology | Admitting: Obstetrics and Gynecology

## 2018-06-13 VITALS — BP 131/85 | HR 86 | Temp 97.8°F | Resp 18 | Wt 232.2 lb

## 2018-06-13 DIAGNOSIS — Z3A34 34 weeks gestation of pregnancy: Secondary | ICD-10-CM | POA: Diagnosis not present

## 2018-06-13 DIAGNOSIS — O09522 Supervision of elderly multigravida, second trimester: Secondary | ICD-10-CM | POA: Diagnosis present

## 2018-06-13 DIAGNOSIS — Z8279 Family history of other congenital malformations, deformations and chromosomal abnormalities: Secondary | ICD-10-CM

## 2018-06-13 DIAGNOSIS — Z3689 Encounter for other specified antenatal screening: Secondary | ICD-10-CM

## 2018-06-25 LAB — OB RESULTS CONSOLE GC/CHLAMYDIA
Chlamydia: NEGATIVE
Gonorrhea: NEGATIVE

## 2018-06-25 LAB — OB RESULTS CONSOLE GBS: GBS: NEGATIVE

## 2018-06-25 LAB — OB RESULTS CONSOLE HIV ANTIBODY (ROUTINE TESTING): HIV: NONREACTIVE

## 2018-06-25 LAB — OB RESULTS CONSOLE RPR: RPR: NONREACTIVE

## 2018-07-04 ENCOUNTER — Other Ambulatory Visit: Payer: Self-pay | Admitting: Obstetrics and Gynecology

## 2018-07-04 ENCOUNTER — Ambulatory Visit
Admission: RE | Admit: 2018-07-04 | Discharge: 2018-07-04 | Disposition: A | Discharge status: Home / Self Care | Payer: Medicaid Other | Source: Ambulatory Visit | Attending: Obstetrics and Gynecology | Admitting: Obstetrics and Gynecology

## 2018-07-04 DIAGNOSIS — Z3A37 37 weeks gestation of pregnancy: Secondary | ICD-10-CM | POA: Insufficient documentation

## 2018-07-04 DIAGNOSIS — Z8279 Family history of other congenital malformations, deformations and chromosomal abnormalities: Secondary | ICD-10-CM | POA: Diagnosis not present

## 2018-07-04 DIAGNOSIS — O09522 Supervision of elderly multigravida, second trimester: Secondary | ICD-10-CM | POA: Diagnosis not present

## 2018-07-04 DIAGNOSIS — O365930 Maternal care for other known or suspected poor fetal growth, third trimester, not applicable or unspecified: Secondary | ICD-10-CM | POA: Insufficient documentation

## 2018-07-04 DIAGNOSIS — O163 Unspecified maternal hypertension, third trimester: Secondary | ICD-10-CM | POA: Diagnosis not present

## 2018-07-12 ENCOUNTER — Other Ambulatory Visit: Payer: Self-pay | Admitting: Certified Nurse Midwife

## 2018-07-13 ENCOUNTER — Observation Stay: Payer: Medicaid Other | Admitting: Anesthesiology

## 2018-07-13 ENCOUNTER — Inpatient Hospital Stay
Admission: EM | Admit: 2018-07-13 | Discharge: 2018-07-14 | DRG: 807 | Disposition: A | Discharge status: Home / Self Care | Payer: Medicaid Other | Attending: Obstetrics and Gynecology | Admitting: Obstetrics and Gynecology

## 2018-07-13 ENCOUNTER — Other Ambulatory Visit: Payer: Self-pay

## 2018-07-13 DIAGNOSIS — O1002 Pre-existing essential hypertension complicating childbirth: Principal | ICD-10-CM | POA: Diagnosis present

## 2018-07-13 DIAGNOSIS — O10919 Unspecified pre-existing hypertension complicating pregnancy, unspecified trimester: Secondary | ICD-10-CM | POA: Diagnosis present

## 2018-07-13 DIAGNOSIS — O09529 Supervision of elderly multigravida, unspecified trimester: Secondary | ICD-10-CM

## 2018-07-13 DIAGNOSIS — Z3A39 39 weeks gestation of pregnancy: Secondary | ICD-10-CM

## 2018-07-13 DIAGNOSIS — Z87891 Personal history of nicotine dependence: Secondary | ICD-10-CM

## 2018-07-13 DIAGNOSIS — Z3490 Encounter for supervision of normal pregnancy, unspecified, unspecified trimester: Secondary | ICD-10-CM

## 2018-07-13 DIAGNOSIS — Q270 Congenital absence and hypoplasia of umbilical artery: Secondary | ICD-10-CM

## 2018-07-13 LAB — CBC
HCT: 34.6 % — ABNORMAL LOW (ref 36.0–46.0)
Hemoglobin: 11.3 g/dL — ABNORMAL LOW (ref 12.0–15.0)
MCH: 27.8 pg (ref 26.0–34.0)
MCHC: 32.7 g/dL (ref 30.0–36.0)
MCV: 85 fL (ref 80.0–100.0)
Platelets: 179 10*3/uL (ref 150–400)
RBC: 4.07 MIL/uL (ref 3.87–5.11)
RDW: 13.6 % (ref 11.5–15.5)
WBC: 7.3 10*3/uL (ref 4.0–10.5)
nRBC: 0 % (ref 0.0–0.2)

## 2018-07-13 LAB — TYPE AND SCREEN
ABO/RH(D): O POS
ANTIBODY SCREEN: NEGATIVE

## 2018-07-13 MED ORDER — FERROUS SULFATE 325 (65 FE) MG PO TABS
325.0000 mg | ORAL_TABLET | Freq: Two times a day (BID) | ORAL | Status: DC
  Administered 2018-07-14 (×2): 325 mg via ORAL
  Filled 2018-07-13 (×2): qty 1

## 2018-07-13 MED ORDER — PHENYLEPHRINE 40 MCG/ML (10ML) SYRINGE FOR IV PUSH (FOR BLOOD PRESSURE SUPPORT): 80.0000 ug | PREFILLED_SYRINGE | INTRAVENOUS | Status: DC | PRN

## 2018-07-13 MED ORDER — DIPHENHYDRAMINE HCL 50 MG/ML IJ SOLN
12.5000 mg | INTRAMUSCULAR | Status: DC | PRN
  Administered 2018-07-13: 12.5 mg via INTRAVENOUS
  Filled 2018-07-13: qty 1

## 2018-07-13 MED ORDER — SOD CITRATE-CITRIC ACID 500-334 MG/5ML PO SOLN: 30.0000 mL | ORAL | Status: DC | PRN

## 2018-07-13 MED ORDER — EPHEDRINE 5 MG/ML INJ: 10.0000 mg | INTRAVENOUS | Status: DC | PRN

## 2018-07-13 MED ORDER — DIPHENHYDRAMINE HCL 25 MG PO CAPS: 25.0000 mg | ORAL_CAPSULE | Freq: Four times a day (QID) | ORAL | Status: DC | PRN

## 2018-07-13 MED ORDER — AMMONIA AROMATIC IN INHA
RESPIRATORY_TRACT | Status: AC
  Filled 2018-07-13: qty 10

## 2018-07-13 MED ORDER — OXYTOCIN BOLUS FROM INFUSION: 500.0000 mL | Freq: Once | INTRAVENOUS | Status: DC

## 2018-07-13 MED ORDER — ZOLPIDEM TARTRATE 5 MG PO TABS: 5.0000 mg | ORAL_TABLET | Freq: Every evening | ORAL | Status: DC | PRN

## 2018-07-13 MED ORDER — OXYTOCIN 40 UNITS IN LACTATED RINGERS INFUSION - SIMPLE MED
INTRAVENOUS | Status: AC
  Filled 2018-07-13: qty 1000

## 2018-07-13 MED ORDER — FENTANYL 2.5 MCG/ML W/ROPIVACAINE 0.15% IN NS 100 ML EPIDURAL (ARMC)
EPIDURAL | Status: AC
  Filled 2018-07-13: qty 100

## 2018-07-13 MED ORDER — MISOPROSTOL 25 MCG QUARTER TABLET
ORAL_TABLET | ORAL | Status: AC
  Administered 2018-07-13: 25 ug
  Filled 2018-07-13: qty 1

## 2018-07-13 MED ORDER — ONDANSETRON HCL 4 MG PO TABS: 4.0000 mg | ORAL_TABLET | ORAL | Status: DC | PRN

## 2018-07-13 MED ORDER — COCONUT OIL OIL
1.0000 "application " | TOPICAL_OIL | Status: DC | PRN
  Administered 2018-07-14: 1 via TOPICAL
  Filled 2018-07-13: qty 120

## 2018-07-13 MED ORDER — MISOPROSTOL 200 MCG PO TABS
ORAL_TABLET | ORAL | Status: AC
  Filled 2018-07-13: qty 4

## 2018-07-13 MED ORDER — SENNOSIDES-DOCUSATE SODIUM 8.6-50 MG PO TABS
2.0000 | ORAL_TABLET | ORAL | Status: DC
  Administered 2018-07-14: 2 via ORAL
  Filled 2018-07-13 (×2): qty 2

## 2018-07-13 MED ORDER — MAGNESIUM HYDROXIDE 400 MG/5ML PO SUSP: 30.0000 mL | ORAL | Status: DC | PRN

## 2018-07-13 MED ORDER — TERBUTALINE SULFATE 1 MG/ML IJ SOLN
0.2500 mg | Freq: Once | INTRAMUSCULAR | Status: AC | PRN
  Administered 2018-07-13: 0.25 mg via SUBCUTANEOUS
  Filled 2018-07-13: qty 1

## 2018-07-13 MED ORDER — LACTATED RINGERS IV SOLN
500.0000 mL | INTRAVENOUS | Status: DC | PRN
  Administered 2018-07-13: 500 mL via INTRAVENOUS
  Administered 2018-07-13: 250 mL via INTRAVENOUS

## 2018-07-13 MED ORDER — PRENATAL MULTIVITAMIN CH
1.0000 | ORAL_TABLET | Freq: Every day | ORAL | Status: DC
  Administered 2018-07-14: 1 via ORAL
  Filled 2018-07-13: qty 1

## 2018-07-13 MED ORDER — SODIUM CHLORIDE 0.9 % IV SOLN
INTRAVENOUS | Status: DC | PRN
  Administered 2018-07-13 (×2): 5 mL via EPIDURAL

## 2018-07-13 MED ORDER — ONDANSETRON HCL 4 MG/2ML IJ SOLN
4.0000 mg | INTRAMUSCULAR | Status: DC | PRN
  Administered 2018-07-13: 4 mg via INTRAVENOUS
  Filled 2018-07-13: qty 2

## 2018-07-13 MED ORDER — FENTANYL 2.5 MCG/ML W/ROPIVACAINE 0.15% IN NS 100 ML EPIDURAL (ARMC)
12.0000 mL/h | EPIDURAL | Status: DC
  Administered 2018-07-13: 12 mL/h via EPIDURAL

## 2018-07-13 MED ORDER — ACETAMINOPHEN 325 MG PO TABS: 650.0000 mg | ORAL_TABLET | ORAL | Status: DC | PRN

## 2018-07-13 MED ORDER — LIDOCAINE HCL (PF) 1 % IJ SOLN: 30.0000 mL | INTRAMUSCULAR | Status: DC | PRN

## 2018-07-13 MED ORDER — LIDOCAINE-EPINEPHRINE (PF) 1.5 %-1:200000 IJ SOLN
INTRAMUSCULAR | Status: DC | PRN
  Administered 2018-07-13: 3 mL via EPIDURAL

## 2018-07-13 MED ORDER — LACTATED RINGERS IV SOLN
500.0000 mL | Freq: Once | INTRAVENOUS | Status: AC
  Administered 2018-07-13: 500 mL via INTRAVENOUS

## 2018-07-13 MED ORDER — IBUPROFEN 600 MG PO TABS
600.0000 mg | ORAL_TABLET | Freq: Four times a day (QID) | ORAL | Status: DC
  Administered 2018-07-13 – 2018-07-14 (×5): 600 mg via ORAL
  Filled 2018-07-13 (×5): qty 1

## 2018-07-13 MED ORDER — SIMETHICONE 80 MG PO CHEW: 80.0000 mg | CHEWABLE_TABLET | ORAL | Status: DC | PRN

## 2018-07-13 MED ORDER — DIBUCAINE 1 % RE OINT: 1.0000 "application " | TOPICAL_OINTMENT | RECTAL | Status: DC | PRN

## 2018-07-13 MED ORDER — LACTATED RINGERS IV SOLN
INTRAVENOUS | Status: DC
  Administered 2018-07-13 (×2): via INTRAVENOUS

## 2018-07-13 MED ORDER — WITCH HAZEL-GLYCERIN EX PADS: 1.0000 "application " | MEDICATED_PAD | CUTANEOUS | Status: DC | PRN

## 2018-07-13 MED ORDER — OXYTOCIN 10 UNIT/ML IJ SOLN
INTRAMUSCULAR | Status: AC
  Filled 2018-07-13: qty 2

## 2018-07-13 MED ORDER — ACETAMINOPHEN 325 MG PO TABS
650.0000 mg | ORAL_TABLET | ORAL | Status: DC | PRN
  Administered 2018-07-13 – 2018-07-14 (×2): 650 mg via ORAL
  Filled 2018-07-13 (×2): qty 2

## 2018-07-13 MED ORDER — BUTORPHANOL TARTRATE 2 MG/ML IJ SOLN: 1.0000 mg | INTRAMUSCULAR | Status: DC | PRN

## 2018-07-13 MED ORDER — ONDANSETRON HCL 4 MG/2ML IJ SOLN: 4.0000 mg | Freq: Four times a day (QID) | INTRAMUSCULAR | Status: DC | PRN

## 2018-07-13 MED ORDER — MISOPROSTOL 25 MCG QUARTER TABLET: 25.0000 ug | ORAL_TABLET | ORAL | Status: DC | PRN

## 2018-07-13 MED ORDER — LIDOCAINE HCL (PF) 1 % IJ SOLN
INTRAMUSCULAR | Status: DC | PRN
  Administered 2018-07-13: 1 mL via INTRADERMAL

## 2018-07-13 MED ORDER — OXYTOCIN 40 UNITS IN LACTATED RINGERS INFUSION - SIMPLE MED
2.5000 [IU]/h | INTRAVENOUS | Status: DC
  Filled 2018-07-13: qty 1000

## 2018-07-13 MED ORDER — MEASLES, MUMPS & RUBELLA VAC IJ SOLR
0.5000 mL | Freq: Once | INTRAMUSCULAR | Status: DC
  Filled 2018-07-13: qty 0.5

## 2018-07-13 MED ORDER — BENZOCAINE-MENTHOL 20-0.5 % EX AERO
1.0000 "application " | INHALATION_SPRAY | CUTANEOUS | Status: DC | PRN
  Filled 2018-07-13: qty 56

## 2018-07-13 NOTE — Anesthesia Procedure Notes (Signed)
Epidural Patient location during procedure: OB Start time: 07/13/2018 10:37 AM End time: 07/13/2018 10:42 AM  Staffing Anesthesiologist: Piscitello, Cleda MccreedyJoseph K, MD Performed: anesthesiologist   Preanesthetic Checklist Completed: patient identified, site marked, surgical consent, pre-op evaluation, timeout performed, IV checked, risks and benefits discussed and monitors and equipment checked  Epidural Patient position: sitting Prep: ChloraPrep Patient monitoring: heart rate, continuous pulse ox and blood pressure Approach: midline Location: L3-L4 Injection technique: LOR saline  Needle:  Needle type: Tuohy  Needle gauge: 17 G Needle length: 9 cm and 9 Needle insertion depth: 6.5 cm Catheter type: closed end flexible Catheter size: 19 Gauge Catheter at skin depth: 11.5 cm Test dose: negative and 1.5% lidocaine with Epi 1:200 K  Assessment Sensory level: T10 Events: blood not aspirated, injection not painful, no injection resistance, negative IV test and no paresthesia  Additional Notes 1 attempt Pt. Evaluated and documentation done after procedure finished. Patient identified. Risks/Benefits/Options discussed with patient including but not limited to bleeding, infection, nerve damage, paralysis, failed block, incomplete pain control, headache, blood pressure changes, nausea, vomiting, reactions to medication both or allergic, itching and postpartum back pain. Confirmed with bedside nurse the patient's most recent platelet count. Confirmed with patient that they are not currently taking any anticoagulation, have any bleeding history or any family history of bleeding disorders. Patient expressed understanding and wished to proceed. All questions were answered. Sterile technique was used throughout the entire procedure. Please see nursing notes for vital signs. Test dose was given through epidural catheter and negative prior to continuing to dose epidural or start infusion. Warning  signs of high block given to the patient including shortness of breath, tingling/numbness in hands, complete motor block, or any concerning symptoms with instructions to call for help. Patient was given instructions on fall risk and not to get out of bed. All questions and concerns addressed with instructions to call with any issues or inadequate analgesia.   Patient tolerated the insertion well without immediate complications.Reason for block:procedure for pain

## 2018-07-13 NOTE — Progress Notes (Signed)
Called to patient's bedside for prolonged deceleration. When I arrived, patient was in hands and knees position, with IVF running and oxygen mask applied to face. At this time fetal heart tones had returned to baseline. Deceleration to the 90s for 4 minutes with broken tracing. Patient repositioned into left lateral position and fetal heart tracing maintained. S/p misoprostol x 1 at 0330. Patient reports mild contractions. Continue to monitor closely.   Genia DelMargaret Shahan Starks, CNM 07/13/2018 5:16 AM
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
## 2018-07-13 NOTE — Progress Notes (Signed)
Reed PandyLinda Leipold is a 37 y.o. [id number] at 745w0d [facility] . Several decelerations noted  Objective: BP 124/65   Pulse 72   Temp 98 F (36.7 C) (Oral)   Resp 16   Ht 5\' 11"  (1.803 m)   Wt 107 kg   LMP 09/13/2017   BMI 32.92 kg/m  No intake/output data recorded. No intake/output data recorded. AROM : clear fluifd and IUPC and FSE placed  FHT:  FHR: 130 bpm, variability: moderate,  accelerations:  Present,  decelerations:  Absent UC:   regular, every 2-3 minutes SVE:   Dilation: 2 Effacement (%): 50 Station: -2 Exam by:: Chrystopher Stangl  Labs: Lab Results  Component Value Date   WBC 7.3 07/13/2018   HGB 11.3 (L) 07/13/2018   HCT 34.6 (L) 07/13/2018   MCV 85.0 07/13/2018   PLT 179 07/13/2018    Assessment / Plan: reasurring fetal monitoring now that she is ruptured and internalized . Pt is requesting an CLE    Ihor Austinhomas J Thalya Fouche 07/13/2018, 10:11 AM

## 2018-07-13 NOTE — Discharge Summary (Signed)
Obstetric Discharge Summary   Patient ID: Patient Name: Jeanne Hamilton DOB: 1981-06-03 MRN: [id number]  Date of Admission: 07/13/2018 Date of Delivery:07/13/18 Delivered UX:[id number]:Dayshon Roback MD Date of Discharge: 07/14/2018  [facility] GUY:[id number]:Patient's last menstrual period was 09/13/2017. EDC Estimated Date of Delivery: 07/20/18 Gestational Age at Delivery: 122w0d   Antepartum complications: CHTN , AMA , SUA Admitting Diagnosis:same  Secondary Diagnoses: Patient Active Problem List   Diagnosis Date Noted  . Chronic hypertension affecting pregnancy 07/13/2018  . Single umbilical artery 07/13/2018  . Encounter for planned induction of labor 07/13/2018  . Indication for care in labor or delivery 07/13/2018  . Disc disease, degenerative, lumbar or lumbosacral 12/15/2015  . NST (non-stress test) reactive on fetal surveillance 06/27/2015  . Advanced maternal age in multigravida 12/21/2014  . Family history of chromosomal abnormality 12/21/2014    Augmentation: none Complications: None Intrapartum complications/course:  Delivery Type: spontaneous vaginal delivery Anesthesia: epidural Placenta: spontaneous Laceration:  Episiotomy: none  Newborn Data: Live born female  Birth Weight: 6 lb 2.4 oz (2790 g) APGAR: 9, 10  Newborn Delivery   Birth date/time:  07/13/2018 15:40:00 Delivery type:  Vaginal, Spontaneous         Postpartum Course   Some vertigo with head movement which improved by d/c . She was able to ambulate without difficulty and no dizziness.  Patient had an uncomplicated postpartum course.  By time of discharge on PPD#1POD#, her pain was controlled on oral pain medications; she had appropriate lochia and was ambulating, voiding without difficulty and tolerating regular diet.  She was deemed stable for discharge to home.       Labs: CBC Latest Ref Rng & Units 07/14/2018 07/13/2018 09/14/2017  WBC 4.0 - 10.5 K/uL 7.3 7.3 5.8  Hemoglobin  12.0 - 15.0 g/dL 10.4(L) 11.3(L) 12.1  Hematocrit 36.0 - 46.0 % 31.6(L) 34.6(L) 36.7  Platelets 150 - 400 K/uL 147(L) 179 162   O POS  Physical exam:  BP 130/89 (BP Location: Right Arm)   Pulse 67   Temp 98.1 F (36.7 C) (Oral)   Resp 18   Ht 5\' 11"  (1.803 m)   Wt 107 kg   LMP 09/13/2017   SpO2 100%   Breastfeeding Unknown   BMI 32.92 kg/m  General: alert and no distress Pulm: normal respiratory effort Lochia: appropriate Abdomen: soft, NT Uterine Fundus: firm, below umbilicus Extremities: No evidence of DVT seen on physical exam. No lower extremity edema.   Disposition: stable, discharge to home Baby Feeding: breastmilk Baby Disposition: home with mom  Contraception: Interval L/S BTL given 2 prior surgeries   Prenatal Labs:  Blood type/Rh O+  Antibody screen neg  Rubella Immune  Varicella Immune  RPR NR  HBsAg Neg  HIV NR  GC neg  Chlamydia neg  Genetic screening cfDNA negative  1 hour GTT 154  3 hour GTT 81, 141, 110, 63  GBS negative         Plan:  Jeanne PandyLinda Ytuarte was discharged to home in good condition. [facility] delivery provider in 6 weeks 2 weeks  Discharge Instructions: Per After Visit Summary. Activity: Advance as tolerated. Pelvic rest for 6 weeks.   Diet: Regular Discharge Medications: Allergies as of 07/14/2018      Reactions   Tindamax [tinidazole] Anaphylaxis, Rash   Bactrim [sulfamethoxazole-trimethoprim]    Metrogel [metronidazole] Rash      Medication List    STOP taking these medications   aspirin EC 81 MG  tablet     TAKE these medications   ibuprofen 600 MG tablet Commonly known as:  ADVIL,MOTRIN Take 1 tablet (600 mg total) by mouth every 6 (six) hours.   NIFEdipine 30 MG 24 hr tablet Commonly known as:  ADALAT CC Take 30 mg by mouth daily.   prenatal multivitamin Tabs tablet Take 1 tablet by mouth daily at 12 noon.      Outpatient follow up:  Follow-up  Information    Alohilani Levenhagen, Ihor Austin, MD Follow up in 2 week(s).   Specialty:  Obstetrics and Gynecology Why:  bp check and scheduled L/S BTL  Contact information: [address] [place] [address] [phone]            Signed:  Ihor Austin Raeford Brandenburg
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
## 2018-07-13 NOTE — Progress Notes (Signed)
Labor Progress Note  Jeanne Hamilton is a 37 y.o. [phone] at 6985w0d by ultrasound admitted for induction of labor due to chronic hypertension on meds, single umbilical artery, and advanced maternal age.  Subjective:  Feeling mild contractions. Feeling a bit scared about multiple   Objective: BP 125/82   Pulse 80   Temp 97.9 F (36.6 C) (Oral)   Resp (!) 22   Ht 5\' 11"  (1.803 m)   Wt 107 kg   LMP 09/13/2017   BMI 32.92 kg/m  Notable VS details: normal to mild rnage BPs  Fetal Assessment: FHT:  FHR: 140 bpm, variability: moderate,  accelerations:  Present,  decelerations:  Present variable, prolonged Category/reactivity:  Category II UC: occasional SVE: 2cm/50%/-3 per RN Parks RangerBrittany Blalock at 0330   Membrane status: intact Amniotic color: n/a  Labs: Lab Results  Component Value Date   WBC 7.3 07/13/2018   HGB 11.3 (L) 07/13/2018   HCT 34.6 (L) 07/13/2018   MCV 85.0 07/13/2018   PLT 179 07/13/2018    Assessment / Plan: Induction of labor due to chronic hypertension on meds, single umbilical artery, and advanced maternal age  Induction of Labor: s/p one dose of misoprostol Hypertension:  no signs or symptoms of preeclampsia Fetal Wellbeing:  Category II for variable and two prolonged decelerations at 0500 and 0700 Pain Control:  Maternal pain control as desired: IVPM, nitrous, regional anesthesia   Called to the bedside for prolonged decel around 0700. Patient was in hands and knees and baby was recovered to baseline. Repositioned in left lateral position with maintenance of improved FHT. Plan to let baby recover and defer induction management to on-coming provider. Reviewed potential management options with patient, including misoprostol, pitocin, and/or AROM.    Jeanne Hamilton, CNM 07/13/2018, 7:40 AM

## 2018-07-13 NOTE — Lactation Note (Signed)
This note was copied from a baby's chart. Lactation Consultation Note  Patient Name: Jeanne Hamilton [id number]'YToday's Date: 07/13/2018 Reason for consult: Initial assessment;Term Assisted mom with first breast feeding after delivery.  He was already skin to skin with mom.  Mom is experienced breast feeder, breast feeding other 3 from 8 months to 2 months.  Demonstrated hand expression.  Gently guided him to left breast in cradle hold.  Newborn latched with minimal assistance and began strong rhythmic sucking immediately.  Initially he had a shallow latch.  Mom could tell the difference when chin was gently lowered and he achieved a deeper latch.  Mom reports this baby is catching on quicker than other 3 babies did.  Reviewed supply and demand, normal course of lactation and routine newborn feeding patterns.    Maternal Data Formula Feeding for Exclusion: No Has patient been taught Hand Expression?: Yes Does the patient have breastfeeding experience prior to this delivery?: Yes  Feeding Feeding Type: Breast Fed  LATCH Score Latch: Grasps breast easily, tongue down, lips flanged, rhythmical sucking.  Audible Swallowing: A few with stimulation  Type of Nipple: Everted at rest and after stimulation  Comfort (Breast/Nipple): Soft / non-tender  Hold (Positioning): Assistance needed to correctly position infant at breast and maintain latch.  LATCH Score: 8  Interventions Interventions: Breast feeding basics reviewed;Assisted with latch;Skin to skin;Reverse pressure;Breast massage;Breast compression;Adjust position;Support pillows;Position options;Hand express  Lactation Tools Discussed/Used WIC Program: Yes   Consult Status Consult Status: Follow-up Date: 07/13/18 Follow-up type: Call as needed    Jeanne Hamilton, Jeanne Hamilton 07/13/2018, 5:46 PM

## 2018-07-14 LAB — CBC
HCT: 31.6 % — ABNORMAL LOW (ref 36.0–46.0)
Hemoglobin: 10.4 g/dL — ABNORMAL LOW (ref 12.0–15.0)
MCH: 28.3 pg (ref 26.0–34.0)
MCHC: 32.9 g/dL (ref 30.0–36.0)
MCV: 86.1 fL (ref 80.0–100.0)
PLATELETS: 147 10*3/uL — AB (ref 150–400)
RBC: 3.67 MIL/uL — ABNORMAL LOW (ref 3.87–5.11)
RDW: 14 % (ref 11.5–15.5)
WBC: 7.3 10*3/uL (ref 4.0–10.5)
nRBC: 0 % (ref 0.0–0.2)

## 2018-07-14 LAB — RPR: RPR Ser Ql: NONREACTIVE

## 2018-07-14 MED ORDER — MECLIZINE HCL 25 MG PO TABS
25.0000 mg | ORAL_TABLET | Freq: Two times a day (BID) | ORAL | Status: DC | PRN
  Administered 2018-07-14 (×2): 25 mg via ORAL
  Filled 2018-07-14 (×4): qty 1

## 2018-07-14 MED ORDER — NIFEDIPINE ER OSMOTIC RELEASE 30 MG PO TB24
30.0000 mg | ORAL_TABLET | Freq: Once | ORAL | Status: AC
  Administered 2018-07-14: 30 mg via ORAL
  Filled 2018-07-14: qty 1

## 2018-07-14 MED ORDER — IBUPROFEN 600 MG PO TABS: 600.0000 mg | ORAL_TABLET | Freq: Four times a day (QID) | ORAL | 0 refills | Status: DC

## 2018-07-14 NOTE — Progress Notes (Signed)
Notified MD on call of pt c/o dizziness when looks to left or right accompanied by some nausea. VS WNL. MD Schermerhorn ordered Meclizine 25mg  po bid prn dizziness. Orders placed. Will cont to monitor.

## 2018-07-14 NOTE — Anesthesia Postprocedure Evaluation (Signed)
Anesthesia Post Note  Patient: Jeanne PandyLinda Kopf  Procedure(s) Performed: AN AD HOC LABOR EPIDURAL  Patient location during evaluation: Mother Baby Anesthesia Type: Epidural Level of consciousness: awake and alert Pain management: pain level controlled Vital Signs Assessment: post-procedure vital signs reviewed and stable Respiratory status: spontaneous breathing, nonlabored ventilation and respiratory function stable Cardiovascular status: stable Postop Assessment: no headache, no backache and epidural receding Anesthetic complications: no     Last Vitals:  Vitals:   07/14/18 1218 07/14/18 1300  BP: (!) 141/90 (!) 151/94  Pulse: 69 80  Resp: 20   Temp: 36.7 C   SpO2: 99%     Last Pain:  Vitals:   07/14/18 1218  TempSrc: Oral  PainSc:                  Lenard SimmerAndrew Vallory Oetken

## 2018-07-14 NOTE — Progress Notes (Signed)
Patient complained of "the room spinning" and nausea during morning assessment at 0725 but states that "it's only when she turns her head to the side." Patient given meclizine by previous shift RN at [phone]. RN got patient up to bathroom at 0945. Patient walked around room as well. Patient states that she has no dizziness or nausea when up moving around or sitting on toilet. RN tested patient's dizziness and "room spinning" feeling with the turning of her head when patient got back in bed. Patient turned head to both sides and states "its gone." MD notified.

## 2018-07-14 NOTE — Progress Notes (Signed)
Discharge instructions given. Patient verbalizes understanding of teaching. Prescription sent to pharmacy by provider.

## 2018-08-26 NOTE — H&P (Signed)
  Jeanne Hamilton is a 38 y.o. female here forL/S BTL ( elective ) OB/GYN History:          OB History    Gravida  7   Para  4   Term  4   Preterm      AB  3   Living  4     SAB  2   TAB  1   Ectopic      Molar      Multiple      Live Births  4       Obstetric Comments  Pregnancy #5 trisomy 83       Recent pregnancy  Date of delivery: Breast Feeding: [] ? Yes  [x] ? No Menses: Last Pap:  Rubella Immune: [x] ? Yes  [] ? No Edinburgh Depression Screening Score:9    Exam:      Vitals:   08/20/18 1408  BP: (!) 135/94  Pulse: 77    Body mass index is 32.28 kg/m.  WDWN  female in NAD   Lungs: CTA  CV : RRR without murmur   Breast: exam done in sitting and lying position : No dimpling or retraction, no dominant mass, no spontaneous discharge, no axillary adenopathy Neck:  no thyromegaly Abdomen: soft , no mass, normal active bowel sounds,  non-tender, no rebound tenderness Pelvic: tanner stage 5 ,  External genitalia: vulva /labia no lesions Urethra: no prolapse Vagina: normal physiologic d/c Cervix: no lesions, no cervical motion tenderness   Uterus: normal size shape and contour, non-tender Adnexa: no mass,  non-tender   Rectovaginal:  Impression:   Elective laparoscopic bilateral tubal ligation    Plan:    BTL . Laparoscopic  Pt has been counseled regarding the risks of the procedure . All questions answered . FAilure rate 1:300 / yr  Vilma Prader, MD

## 2018-08-30 ENCOUNTER — Other Ambulatory Visit: Payer: Self-pay

## 2018-08-30 ENCOUNTER — Encounter
Admission: RE | Admit: 2018-08-30 | Discharge: 2018-08-30 | Disposition: A | Discharge status: Home / Self Care | Payer: Medicaid Other | Source: Ambulatory Visit | Attending: Obstetrics and Gynecology | Admitting: Obstetrics and Gynecology

## 2018-08-30 HISTORY — DX: Dizziness and giddiness: R42

## 2018-08-30 NOTE — Patient Instructions (Signed)
Your procedure is scheduled on: 09-06-18 FRIDAY Report to Same Day Surgery 2nd floor medical mall [facility] Entrance-take elevator on left to 2nd floor.  Check in with surgery information desk.) To find out your arrival time please call [phone] between 1PM - 3PM on 09-05-18 THURSDAY  Remember: Instructions that are not followed completely may result in serious medical risk, up to and including death, or upon the discretion of your surgeon and anesthesiologist your surgery may need to be rescheduled.    _x___ 1. Do not eat food after midnight the night before your procedure. NO GUM OR CANDY AFTER MIDNIGHT.  You may drink clear liquids up to 2 hours before you are scheduled to arrive at the hospital for your procedure.  Do not drink clear liquids within 2 hours of your scheduled arrival to the hospital.  Clear liquids include  --Water or Apple juice without pulp  --Clear carbohydrate beverage such as ClearFast or Gatorade  --Black Coffee or Clear Tea (No milk, no creamers, do not add anything to the coffee or Tea   ____Ensure clear carbohydrate drink on the way to the hospital for bariatric patients  ____Ensure clear carbohydrate drink 3 hours before surgery for Dr Rutherford Nail patients if physician instructed.    __x__ 2. No Alcohol for 24 hours before or after surgery.   __x__3. No Smoking or e-cigarettes for 24 prior to surgery.  Do not use any chewable tobacco products for at least 6 hour prior to surgery   ____  4. Bring all medications with you on the day of surgery if instructed.    __x__ 5. Notify your doctor if there is any change in your medical condition     (cold, fever, infections).    x___6. On the morning of surgery brush your teeth with toothpaste and water.  You may rinse your mouth with mouth wash if you wish.  Do not swallow any toothpaste or mouthwash.   Do not wear jewelry, make-up, hairpins, clips or nail polish.  Do not wear lotions, powders, or perfumes. You  may wear deodorant.  Do not shave 48 hours prior to surgery. Men may shave face and neck.  Do not bring valuables to the hospital.    [facility] is not responsible for any belongings or valuables.               Contacts, dentures or bridgework may not be worn into surgery.  Leave your suitcase in the car. After surgery it may be brought to your room.  For patients admitted to the hospital, discharge time is determined by your treatment team.  _  Patients discharged the day of surgery will not be allowed to drive home.  You will need someone to drive you home and stay with you the night of your procedure.    Please read over the following fact sheets that you were given:   [facility] Preparing for Surgery   _x___ TAKE THE FOLLOWING MEDICATION THE MORNING OF SURGERY WITH A SMALL SIP OF WATER. These include:  1. NIFEDIPINE (PROCARDIA)  2. ZOLOFT (SERTRALINE)  3.  4.  5.  6.  ____Fleets enema or Magnesium Citrate as directed.   _x___ Use CHG Soap or sage wipes as directed on instruction sheet   ____ Use inhalers on the day of surgery and bring to hospital day of surgery  ____ Stop Metformin and Janumet 2 days prior to surgery.    ____ Take 1/2 of usual insulin dose the  night before surgery and none on the morning surgery.   ____ Follow recommendations from Cardiologist, Pulmonologist or PCP regarding stopping Aspirin, Coumadin, Plavix ,Eliquis, Effient, or Pradaxa, and Pletal.  X____Stop Anti-inflammatories such as Advil, Aleve, Ibuprofen, Motrin, Naproxen, Naprosyn, Goodies powders or aspirin products NOW-OK to take Tylenol    ____ Stop supplements until after surgery   ____ Bring C-Pap to the hospital.

## 2018-09-02 ENCOUNTER — Inpatient Hospital Stay: Admission: RE | Admit: 2018-09-02 | Payer: Medicaid Other | Source: Ambulatory Visit

## 2018-09-03 ENCOUNTER — Encounter
Admission: RE | Admit: 2018-09-03 | Discharge: 2018-09-03 | Disposition: A | Discharge status: Home / Self Care | Payer: Medicaid Other | Source: Ambulatory Visit | Attending: Obstetrics and Gynecology | Admitting: Obstetrics and Gynecology

## 2018-09-03 ENCOUNTER — Other Ambulatory Visit: Payer: Self-pay

## 2018-09-03 DIAGNOSIS — I10 Essential (primary) hypertension: Secondary | ICD-10-CM | POA: Diagnosis present

## 2018-09-03 LAB — BASIC METABOLIC PANEL
Anion gap: 6 (ref 5–15)
BUN: 9 mg/dL (ref 6–20)
CO2: 29 mmol/L (ref 22–32)
Calcium: 9 mg/dL (ref 8.9–10.3)
Chloride: 103 mmol/L (ref 98–111)
Creatinine, Ser: 0.63 mg/dL (ref 0.44–1.00)
GFR calc Af Amer: >60 mL/min (ref >60)
GFR calc non Af Amer: >60 mL/min (ref >60)
Glucose, Bld: 97 mg/dL (ref 70–99)
Potassium: 4.2 mmol/L (ref 3.5–5.1)
SODIUM: 138 mmol/L (ref 135–145)

## 2018-09-03 LAB — TYPE AND SCREEN
ABO/RH(D): O POS
Antibody Screen: NEGATIVE
Extend sample reason: UNDETERMINED

## 2018-09-03 LAB — CBC
HCT: 41.1 % (ref 36.0–46.0)
Hemoglobin: 13.1 g/dL (ref 12.0–15.0)
MCH: 27.2 pg (ref 26.0–34.0)
MCHC: 31.9 g/dL (ref 30.0–36.0)
MCV: 85.3 fL (ref 80.0–100.0)
Platelets: 205 10*3/uL (ref 150–400)
RBC: 4.82 MIL/uL (ref 3.87–5.11)
RDW: 12.5 % (ref 11.5–15.5)
WBC: 5.1 10*3/uL (ref 4.0–10.5)
nRBC: 0 % (ref 0.0–0.2)

## 2018-09-05 ENCOUNTER — Encounter: Payer: Self-pay | Admitting: *Deleted

## 2018-09-05 MED ORDER — FAMOTIDINE 20 MG PO TABS
20.0000 mg | ORAL_TABLET | Freq: Once | ORAL | Status: AC
  Administered 2018-09-06: 20 mg via ORAL

## 2018-09-05 MED ORDER — LACTATED RINGERS IV SOLN
INTRAVENOUS | Status: DC
  Administered 2018-09-06: 09:00:00 via INTRAVENOUS

## 2018-09-05 MED ORDER — ACETAMINOPHEN 500 MG PO TABS
1000.0000 mg | ORAL_TABLET | ORAL | Status: AC
  Administered 2018-09-06: 1000 mg via ORAL

## 2018-09-05 MED ORDER — LACTATED RINGERS IV SOLN: INTRAVENOUS | Status: DC

## 2018-09-05 MED ORDER — GABAPENTIN 300 MG PO CAPS
300.0000 mg | ORAL_CAPSULE | ORAL | Status: AC
  Administered 2018-09-06: 300 mg via ORAL

## 2018-09-06 ENCOUNTER — Ambulatory Visit
Admission: RE | Admit: 2018-09-06 | Discharge: 2018-09-06 | Disposition: A | Discharge status: Home / Self Care | Payer: Medicaid Other | Attending: Obstetrics and Gynecology | Admitting: Obstetrics and Gynecology

## 2018-09-06 ENCOUNTER — Ambulatory Visit: Payer: Medicaid Other | Admitting: Anesthesiology

## 2018-09-06 ENCOUNTER — Other Ambulatory Visit: Payer: Self-pay

## 2018-09-06 ENCOUNTER — Encounter
Admission: RE | Disposition: A | Discharge status: Home / Self Care | Payer: Self-pay | Source: Home / Self Care | Attending: Obstetrics and Gynecology

## 2018-09-06 ENCOUNTER — Encounter: Payer: Self-pay | Admitting: *Deleted

## 2018-09-06 DIAGNOSIS — K660 Peritoneal adhesions (postprocedural) (postinfection): Secondary | ICD-10-CM | POA: Diagnosis not present

## 2018-09-06 DIAGNOSIS — I10 Essential (primary) hypertension: Secondary | ICD-10-CM | POA: Diagnosis not present

## 2018-09-06 DIAGNOSIS — Z87891 Personal history of nicotine dependence: Secondary | ICD-10-CM | POA: Insufficient documentation

## 2018-09-06 DIAGNOSIS — Z302 Encounter for sterilization: Secondary | ICD-10-CM | POA: Insufficient documentation

## 2018-09-06 HISTORY — PX: LAPAROSCOPIC TUBAL LIGATION: SHX1937

## 2018-09-06 LAB — URINE DRUG SCREEN, QUALITATIVE (ARMC ONLY)
Amphetamines, Ur Screen: NOT DETECTED
Barbiturates, Ur Screen: NOT DETECTED
Benzodiazepine, Ur Scrn: NOT DETECTED
Cannabinoid 50 Ng, Ur ~~LOC~~: NOT DETECTED
Cocaine Metabolite,Ur ~~LOC~~: NOT DETECTED
MDMA (Ecstasy)Ur Screen: NOT DETECTED
Methadone Scn, Ur: NOT DETECTED
Opiate, Ur Screen: NOT DETECTED
Phencyclidine (PCP) Ur S: NOT DETECTED
TRICYCLIC, UR SCREEN: NOT DETECTED

## 2018-09-06 LAB — POCT PREGNANCY, URINE: Preg Test, Ur: NEGATIVE

## 2018-09-06 SURGERY — LIGATION, FALLOPIAN TUBE, LAPAROSCOPIC
Anesthesia: General | Laterality: Bilateral

## 2018-09-06 MED ORDER — BUPIVACAINE HCL 0.5 % IJ SOLN
INTRAMUSCULAR | Status: DC | PRN
  Administered 2018-09-06: 8 mL

## 2018-09-06 MED ORDER — ONDANSETRON HCL 4 MG/2ML IJ SOLN
INTRAMUSCULAR | Status: AC
  Filled 2018-09-06: qty 2

## 2018-09-06 MED ORDER — LIDOCAINE HCL (CARDIAC) PF 100 MG/5ML IV SOSY
PREFILLED_SYRINGE | INTRAVENOUS | Status: DC | PRN
  Administered 2018-09-06: 100 mg via INTRAVENOUS

## 2018-09-06 MED ORDER — LACTATED RINGERS IV SOLN
INTRAVENOUS | Status: DC | PRN
  Administered 2018-09-06: 10:00:00 via INTRAVENOUS

## 2018-09-06 MED ORDER — HYDROMORPHONE HCL 1 MG/ML IJ SOLN
INTRAMUSCULAR | Status: AC
  Filled 2018-09-06: qty 1

## 2018-09-06 MED ORDER — OXYCODONE HCL 5 MG PO TABS
ORAL_TABLET | ORAL | Status: AC
  Filled 2018-09-06: qty 1

## 2018-09-06 MED ORDER — FENTANYL CITRATE (PF) 250 MCG/5ML IJ SOLN
INTRAMUSCULAR | Status: AC
  Filled 2018-09-06: qty 5

## 2018-09-06 MED ORDER — FENTANYL CITRATE (PF) 100 MCG/2ML IJ SOLN
25.0000 ug | INTRAMUSCULAR | Status: DC | PRN
  Administered 2018-09-06 (×2): 25 ug via INTRAVENOUS
  Administered 2018-09-06: 50 ug via INTRAVENOUS

## 2018-09-06 MED ORDER — FAMOTIDINE 20 MG PO TABS
ORAL_TABLET | ORAL | Status: AC
  Administered 2018-09-06: 20 mg via ORAL
  Filled 2018-09-06: qty 1

## 2018-09-06 MED ORDER — ROCURONIUM BROMIDE 50 MG/5ML IV SOLN
INTRAVENOUS | Status: AC
  Filled 2018-09-06: qty 1

## 2018-09-06 MED ORDER — SODIUM CHLORIDE FLUSH 0.9 % IV SOLN
INTRAVENOUS | Status: AC
  Filled 2018-09-06: qty 10

## 2018-09-06 MED ORDER — HYDROMORPHONE HCL 1 MG/ML IJ SOLN
INTRAMUSCULAR | Status: DC | PRN
  Administered 2018-09-06: 0.5 mg via INTRAVENOUS

## 2018-09-06 MED ORDER — FENTANYL CITRATE (PF) 100 MCG/2ML IJ SOLN
INTRAMUSCULAR | Status: AC
  Administered 2018-09-06: 50 ug via INTRAVENOUS
  Filled 2018-09-06: qty 2

## 2018-09-06 MED ORDER — KETOROLAC TROMETHAMINE 30 MG/ML IJ SOLN
INTRAMUSCULAR | Status: DC | PRN
  Administered 2018-09-06: 30 mg via INTRAVENOUS

## 2018-09-06 MED ORDER — GABAPENTIN 300 MG PO CAPS
ORAL_CAPSULE | ORAL | Status: AC
  Administered 2018-09-06: 300 mg via ORAL
  Filled 2018-09-06: qty 1

## 2018-09-06 MED ORDER — BUPIVACAINE HCL (PF) 0.5 % IJ SOLN
INTRAMUSCULAR | Status: AC
  Filled 2018-09-06: qty 30

## 2018-09-06 MED ORDER — MIDAZOLAM HCL 2 MG/2ML IJ SOLN
INTRAMUSCULAR | Status: AC
  Filled 2018-09-06: qty 2

## 2018-09-06 MED ORDER — ROCURONIUM BROMIDE 100 MG/10ML IV SOLN
INTRAVENOUS | Status: DC | PRN
  Administered 2018-09-06: 50 mg via INTRAVENOUS

## 2018-09-06 MED ORDER — SILVER NITRATE-POT NITRATE 75-25 % EX MISC
CUTANEOUS | Status: AC
  Filled 2018-09-06: qty 2

## 2018-09-06 MED ORDER — DEXAMETHASONE SODIUM PHOSPHATE 10 MG/ML IJ SOLN
INTRAMUSCULAR | Status: DC | PRN
  Administered 2018-09-06: 10 mg via INTRAVENOUS

## 2018-09-06 MED ORDER — FENTANYL CITRATE (PF) 100 MCG/2ML IJ SOLN
INTRAMUSCULAR | Status: AC
  Filled 2018-09-06: qty 2

## 2018-09-06 MED ORDER — FENTANYL CITRATE (PF) 100 MCG/2ML IJ SOLN
INTRAMUSCULAR | Status: DC | PRN
  Administered 2018-09-06 (×2): 50 ug via INTRAVENOUS

## 2018-09-06 MED ORDER — PROPOFOL 10 MG/ML IV BOLUS
INTRAVENOUS | Status: AC
  Filled 2018-09-06: qty 20

## 2018-09-06 MED ORDER — PROPOFOL 10 MG/ML IV BOLUS
INTRAVENOUS | Status: DC | PRN
  Administered 2018-09-06: 200 mg via INTRAVENOUS

## 2018-09-06 MED ORDER — ONDANSETRON HCL 4 MG/2ML IJ SOLN
INTRAMUSCULAR | Status: DC | PRN
  Administered 2018-09-06: 4 mg via INTRAVENOUS

## 2018-09-06 MED ORDER — SUGAMMADEX SODIUM 500 MG/5ML IV SOLN
INTRAVENOUS | Status: DC | PRN
  Administered 2018-09-06: 200 mg via INTRAVENOUS

## 2018-09-06 MED ORDER — OXYCODONE HCL 5 MG PO TABS
5.0000 mg | ORAL_TABLET | Freq: Once | ORAL | Status: AC
  Administered 2018-09-06: 5 mg via ORAL

## 2018-09-06 MED ORDER — MIDAZOLAM HCL 2 MG/2ML IJ SOLN
INTRAMUSCULAR | Status: DC | PRN
  Administered 2018-09-06: 2 mg via INTRAVENOUS

## 2018-09-06 MED ORDER — ACETAMINOPHEN 500 MG PO TABS
ORAL_TABLET | ORAL | Status: AC
  Administered 2018-09-06: 1000 mg via ORAL
  Filled 2018-09-06: qty 2

## 2018-09-06 MED ORDER — PROMETHAZINE HCL 25 MG/ML IJ SOLN: 6.2500 mg | INTRAMUSCULAR | Status: DC | PRN

## 2018-09-06 SURGICAL SUPPLY — 33 items
BLADE SURG SZ11 CARB STEEL (BLADE) ×3 IMPLANT
CATH ROBINSON RED A/P 16FR (CATHETERS) ×3 IMPLANT
CHLORAPREP W/TINT 26ML (MISCELLANEOUS) ×3 IMPLANT
CLOSURE WOUND 1/4X4 (GAUZE/BANDAGES/DRESSINGS) ×1
COVER WAND RF STERILE (DRAPES) ×3 IMPLANT
DRSG TEGADERM 2-3/8X2-3/4 SM (GAUZE/BANDAGES/DRESSINGS) ×6 IMPLANT
GLOVE BIO SURGEON STRL SZ8 (GLOVE) ×3 IMPLANT
GOWN STRL REUS W/ TWL LRG LVL3 (GOWN DISPOSABLE) ×1 IMPLANT
GOWN STRL REUS W/ TWL XL LVL3 (GOWN DISPOSABLE) ×1 IMPLANT
GOWN STRL REUS W/TWL LRG LVL3 (GOWN DISPOSABLE) ×2
GOWN STRL REUS W/TWL XL LVL3 (GOWN DISPOSABLE) ×2
GRASPER SUT TROCAR 14GX15 (MISCELLANEOUS) ×3 IMPLANT
KIT DISPOSABLE FALLOPE RING (Ring) ×3 IMPLANT
KIT PINK PAD W/HEAD ARE REST (MISCELLANEOUS) ×3
KIT PINK PAD W/HEAD ARM REST (MISCELLANEOUS) ×1 IMPLANT
KIT TURNOVER CYSTO (KITS) ×3 IMPLANT
LABEL OR SOLS (LABEL) ×3 IMPLANT
NS IRRIG 500ML POUR BTL (IV SOLUTION) ×3 IMPLANT
PACK GYN LAPAROSCOPIC (MISCELLANEOUS) ×3 IMPLANT
PAD OB MATERNITY 4.3X12.25 (PERSONAL CARE ITEMS) ×3 IMPLANT
PAD PREP 24X41 OB/GYN DISP (PERSONAL CARE ITEMS) ×3 IMPLANT
SET TUBE SMOKE EVAC HIGH FLOW (TUBING) ×3 IMPLANT
SHEARS HARMONIC ACE PLUS 36CM (ENDOMECHANICALS) IMPLANT
SPONGE GAUZE 2X2 8PLY STER LF (GAUZE/BANDAGES/DRESSINGS) ×3
SPONGE GAUZE 2X2 8PLY STRL LF (GAUZE/BANDAGES/DRESSINGS) ×4 IMPLANT
STRIP CLOSURE SKIN 1/4X4 (GAUZE/BANDAGES/DRESSINGS) ×2 IMPLANT
SUT VIC AB 0 CT1 36 (SUTURE) ×1 IMPLANT
SUT VIC AB 2-0 UR6 27 (SUTURE) ×1 IMPLANT
SUT VIC AB 4-0 SH 27 (SUTURE) ×2
SUT VIC AB 4-0 SH 27XANBCTRL (SUTURE) ×1 IMPLANT
SWABSTK COMLB BENZOIN TINCTURE (MISCELLANEOUS) ×3 IMPLANT
TROCAR ENDO BLADELESS 11MM (ENDOMECHANICALS) IMPLANT
TROCAR XCEL NON-BLD 5MMX100MML (ENDOMECHANICALS) ×3 IMPLANT
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
## 2018-09-06 NOTE — Anesthesia Procedure Notes (Signed)
Procedure Name: Intubation Date/Time: 09/06/2018 10:46 AM Performed by: Justus Memory, CRNA Pre-anesthesia Checklist: Patient identified, Patient being monitored, Timeout performed, Emergency Drugs available and Suction available Patient Re-evaluated:Patient Re-evaluated prior to induction Oxygen Delivery Method: Circle system utilized Preoxygenation: Pre-oxygenation with 100% oxygen Induction Type: IV induction Ventilation: Mask ventilation without difficulty Laryngoscope Size: Mac and 3 Grade View: Grade I Tube type: Oral Tube size: 7.0 mm Number of attempts: 1 Airway Equipment and Method: Stylet Placement Confirmation: ETT inserted through vocal cords under direct vision,  positive ETCO2 and breath sounds checked- equal and bilateral Secured at: 21 cm Tube secured with: Tape Dental Injury: Teeth and Oropharynx as per pre-operative assessment

## 2018-09-06 NOTE — Anesthesia Postprocedure Evaluation (Signed)
Anesthesia Post Note  Patient: Jeanne Hamilton  Procedure(s) Performed: LAPAROSCOPIC TUBAL LIGATION,with falope rings (Bilateral )  Patient location during evaluation: PACU Anesthesia Type: General Level of consciousness: awake and alert Pain management: pain level controlled Vital Signs Assessment: post-procedure vital signs reviewed and stable Respiratory status: spontaneous breathing, nonlabored ventilation, respiratory function stable and patient connected to nasal cannula oxygen Cardiovascular status: blood pressure returned to baseline and stable Postop Assessment: no apparent nausea or vomiting Anesthetic complications: no     Last Vitals:  Vitals:   09/06/18 1330 09/06/18 1501  BP: 103/83 130/79  Pulse: (!) 52 69  Resp: 12 14  Temp: 36.7 C 36.8 C  SpO2: 100% 99%    Last Pain:  Vitals:   09/06/18 1501  TempSrc: Temporal  PainSc:                  Jeanne Hamilton

## 2018-09-06 NOTE — Progress Notes (Signed)
Ready for surgery L/S BTL  . All questions answered . Labs reviewed . Proceed

## 2018-09-06 NOTE — Brief Op Note (Signed)
09/06/2018  11:27 AM  PATIENT:  Jeanne Hamilton  38 y.o. female  PRE-OPERATIVE DIAGNOSIS:  elective sterilization  POST-OPERATIVE DIAGNOSIS:  elective sterilization  PROCEDURE:  Procedure(s): LAPAROSCOPIC TUBAL LIGATION,with falope rings (Bilateral)  SURGEON:  Surgeon(s) and Role:    * Jazira Maloney, Ihor Austin, MD - Primary  PHYSICIAN ASSISTANT:   ASSISTANTS: none   ANESTHESIA:   general  EBL:  Minimal  IOF 1000 cc uo 400 cc  BLOOD ADMINISTERED:none  DRAINS: none   LOCAL MEDICATIONS USED:  MARCAINE     SPECIMEN:  No Specimen  DISPOSITION OF SPECIMEN:  N/A  COUNTS:  YES  TOURNIQUET:  * No tourniquets in log *  DICTATION: .Other Dictation: Dictation Number verbal  PLAN OF CARE: Discharge to home after PACU  PATIENT DISPOSITION:  PACU - hemodynamically stable.   Delay start of Pharmacological VTE agent (>24hrs) due to surgical blood loss or risk of bleeding: not applicable

## 2018-09-06 NOTE — Anesthesia Post-op Follow-up Note (Signed)
Anesthesia QCDR form completed.        

## 2018-09-06 NOTE — Discharge Instructions (Addendum)
Laparoscopic Tubal Ligation, Care After °Refer to this sheet in the next few weeks. These instructions provide you with information about caring for yourself after your procedure. Your health care provider may also give you more specific instructions. Your treatment has been planned according to current medical practices, but problems sometimes occur. Call your health care provider if you have any problems or questions after your procedure. °What can I expect after the procedure? °After the procedure, it is common to have: °· A sore throat. °· Discomfort in your shoulder. °· Mild discomfort or cramping in your abdomen. °· Gas pains. °· Pain or soreness in the area where the surgical cut (incision) was made. °· A bloated feeling. °· Tiredness. °· Nausea. °· Vomiting. °Follow these instructions at home: °Medicines °· Take over-the-counter and prescription medicines only as told by your health care provider. °· Do not take aspirin because it can cause bleeding. °· Do not drive or operate heavy machinery while taking prescription pain medicine. °Activity °· Rest for the rest of the day. °· Return to your normal activities as told by your health care provider. Ask your health care provider what activities are safe for you. °Incision care ° °  ° °· Follow instructions from your health care provider about how to take care of your incision. Make sure you: °? Wash your hands with soap and water before you change your bandage (dressing). If soap and water are not available, use hand sanitizer. °? Change your dressing as told by your health care provider. °? Leave stitches (sutures) in place. They may need to stay in place for 2 weeks or longer. °· Check your incision area every day for signs of infection. Check for: °? More redness, swelling, or pain. °? More fluid or blood. °? Warmth. °? Pus or a bad smell. °Other Instructions °· Do not take baths, swim, or use a hot tub until your health care provider approves. You may take  showers. °· Keep all follow-up visits as told by your health care provider. This is important. °· Have someone help you with your daily household tasks for the first few days. °Contact a health care provider if: °· You have more redness, swelling, or pain around your incision. °· Your incision feels warm to the touch. °· You have pus or a bad smell coming from your incision. °· The edges of your incision break open after the sutures have been removed. °· Your pain does not improve after 2-3 days. °· You have a rash. °· You repeatedly become dizzy or light-headed. °· Your pain medicine is not helping. °· You are constipated. °Get help right away if: °· You have a fever. °· You faint. °· You have increasing pain in your abdomen. °· You have severe pain in one or both of your shoulders. °· You have fluid or blood coming from your sutures or from your vagina. °· You have shortness of breath or difficulty breathing. °· You have chest pain or leg pain. °· You have ongoing nausea, vomiting, or diarrhea. °This information is not intended to replace advice given to you by your health care provider. Make sure you discuss any questions you have with your health care provider. °Document Released: 02/03/2005 Document Revised: 03/13/2017 Document Reviewed: 06/27/2015 °Elsevier Interactive Patient Education © 2019 [employer] ° ° °AMBULATORY SURGERY  °DISCHARGE INSTRUCTIONS ° ° °1) The drugs that you were given will stay in your system until tomorrow so for the next 24 hours you should not: ° °  A) Drive an automobile °B) Make any legal decisions °C) Drink any alcoholic beverage ° ° °2) You may resume regular meals tomorrow.  Today it is better to start with liquids and gradually work up to solid foods. ° °You may eat anything you prefer, but it is better to start with liquids, then soup and crackers, and gradually work up to solid foods. ° ° °3) Please notify your doctor immediately if you have any unusual bleeding, trouble  breathing, redness and pain at the surgery site, drainage, fever, or pain not relieved by medication. ° ° ° °4) Additional Instructions: ° ° ° ° ° ° ° °Please contact your physician with any problems or Same Day Surgery at [phone], Monday through Friday 6 am to 4 pm, or [facility] number at [phone]. °

## 2018-09-06 NOTE — Op Note (Signed)
NAMEHAVYNN, CASTERLINE MEDICAL RECORD [id number] ACCOUNT [id number] DATE OF BIRTH:[birth year] FACILITY: [facility] LOCATION: [facility] PHYSICIAN:Aliani Caccavale Cloyde Reams, MD  OPERATIVE REPORT  DATE OF PROCEDURE:  09/06/2018  PREOPERATIVE DIAGNOSIS:  Elective permanent sterilization.  POSTOPERATIVE DIAGNOSIS:  Elective permanent sterilization.  PROCEDURE:   Laparoscopic bilateral tubal ligation, Falope rings.  ANESTHESIA:  General endotracheal anesthesia.  SURGEON:  Jennell Corner, MD  INDICATIONS:  A 38 year old gravida 7, para 4.  Patient has elected for permanent sterilization.  DESCRIPTION OF PROCEDURE:  After adequate general endotracheal anesthesia, the patient was placed in dorsal supine position with the legs in the [place] stirrups.  Timeout was performed.  The patient was prepped and draped in normal sterile fashion.  A  sponge stick was placed in the vagina to be used for uterine manipulation during the procedure.  Bladder was drained with red Robinson catheter yielding 400 mL clear urine.  Gloves were changed.  Attention was directed to the patient's abdomen.  A 5 mm  infraumbilical incision was made after injecting with 0.5% Marcaine.  The 5 mm laparoscope was advanced into the abdominal cavity under direct visualization.  The patient's abdomen was insufflated.  The patient was placed in a slight Trendelenburg.  A  second incision was made 2 fingerbreadths above the symphysis pubis and the Falope ring trocar was advanced under direct visualization.  Each fallopian tube was identified with the fimbriated end visualized.  The right fallopian tube was picked up at the  isthmic ampullary portion of fallopian tube and Falope ring was applied with a good 1.5 cm knuckle of fallopian tube distal to the ring.  A similar procedure was repeated on the patient's left fallopian tube.  Pictures were taken.  Of note, the patient  did have some omental adhesions to the anterior abdomen,  which were not taken down.  The patient's abdomen was deflated.  The incisions were closed with interrupted 4-0 Vicryl suture and sterile dressing applied.  Sponge stick was removed.  COMPLICATIONS:  There were no complications.  ESTIMATED BLOOD LOSS:  Minimal.  INTRAOPERATIVE FLUIDS:  1000 mL.  URINE OUTPUT:  400 mL  DISPOSITION:  The patient was taken to recovery room in good condition.  AN/NUANCE  D:09/06/2018 T:09/06/2018 JOB:[id number]

## 2018-09-06 NOTE — Transfer of Care (Signed)
Immediate Anesthesia Transfer of Care Note  Patient: Jeanne PandyLinda Chiaramonte  Procedure(s) Performed: LAPAROSCOPIC TUBAL LIGATION,with falope rings (Bilateral )  Patient Location: PACU  Anesthesia Type:General  Level of Consciousness: sedated  Airway & Oxygen Therapy: Patient Spontanous Breathing and Patient connected to face mask oxygen  Post-op Assessment: Report given to RN and Post -op Vital signs reviewed and stable  Post vital signs: Reviewed and stable  Last Vitals:  Vitals Value Taken Time  BP 147/97 09/06/2018 11:48 AM  Temp 36.2 C 09/06/2018 11:48 AM  Pulse 54 09/06/2018 12:02 PM  Resp 12 09/06/2018 12:02 PM  SpO2 100 % 09/06/2018 12:02 PM  Vitals shown include unvalidated device data.  Last Pain:  Vitals:   09/06/18 1148  PainSc: Asleep         Complications: Pt stable

## 2018-09-08 ENCOUNTER — Encounter: Payer: Self-pay | Admitting: Obstetrics and Gynecology

## 2018-09-10 ENCOUNTER — Encounter: Payer: Self-pay | Admitting: Obstetrics and Gynecology

## 2020-05-14 ENCOUNTER — Emergency Department
Admission: EM | Admit: 2020-05-14 | Discharge: 2020-05-14 | Disposition: A | Discharge status: Home / Self Care | Payer: Medicaid Other | Attending: Student in an Organized Health Care Education/Training Program | Admitting: Student in an Organized Health Care Education/Training Program

## 2020-05-14 ENCOUNTER — Emergency Department: Payer: Medicaid Other

## 2020-05-14 ENCOUNTER — Encounter: Payer: Self-pay | Admitting: Emergency Medicine

## 2020-05-14 ENCOUNTER — Other Ambulatory Visit: Payer: Self-pay

## 2020-05-14 DIAGNOSIS — Z87891 Personal history of nicotine dependence: Secondary | ICD-10-CM | POA: Diagnosis not present

## 2020-05-14 DIAGNOSIS — M5459 Other low back pain: Secondary | ICD-10-CM | POA: Diagnosis not present

## 2020-05-14 DIAGNOSIS — M542 Cervicalgia: Secondary | ICD-10-CM | POA: Diagnosis present

## 2020-05-14 MED ORDER — CYCLOBENZAPRINE HCL 5 MG PO TABS: ORAL_TABLET | ORAL | 0 refills | Status: DC

## 2020-05-14 MED ORDER — IBUPROFEN 400 MG PO TABS: 400.0000 mg | ORAL_TABLET | Freq: Four times a day (QID) | ORAL | 0 refills | Status: DC | PRN
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
## 2020-11-22 DIAGNOSIS — R002 Palpitations: Secondary | ICD-10-CM | POA: Insufficient documentation

## 2020-11-22 DIAGNOSIS — B372 Candidiasis of skin and nail: Secondary | ICD-10-CM | POA: Insufficient documentation

## 2021-08-02 ENCOUNTER — Other Ambulatory Visit: Payer: Self-pay | Admitting: Obstetrics and Gynecology

## 2021-08-02 ENCOUNTER — Other Ambulatory Visit: Payer: Medicaid Other

## 2021-08-08 NOTE — H&P (Signed)
Jeanne Hamilton is a 41 y.o. female here for [facility] and bilateral salpingectomy for menorrhagia and uterine descensus.   .Pt here concern of heavy menstrual flow .Since last SVD ( svdx4 ) she has had bleeding q 21-28 days and lasts for 7-9 days. ++ clots .  Also noted tissue falling with placement of tampons. 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  Past Surgical History:  has a past surgical history that includes Gynecologic cryosurgery (1998); Pelvic laparoscopy (1996); Back surgery (11/29/2015); Left Elbow ORIF; Dilation and curettage, diagnostic / therapeutic; Right Eyelide Cyst Removal; Tonsillectomy; and R Carpal Tunnel Release (Right, 10/12/2017). Family History: family history includes Diabetes in her paternal aunt, paternal grandmother, and paternal uncle; High blood pressure (Hypertension) in her father, mother, paternal aunt, paternal grandmother, and paternal uncle. Social History:  reports that she has quit smoking. Her smoking use included cigarettes. She has never used smokeless tobacco. She reports that she does not drink alcohol and does not use drugs. OB/GYN History:          OB History     Gravida  7   Para  4   Term  4   Preterm      AB  3   Living  4      SAB  2   IAB  1   Ectopic      Molar      Multiple      Live Births  4        Obstetric Comments  Pregnancy #5 trisomy 21           Allergies: is allergic to bactrim [sulfamethoxazole-trimethoprim], metrogel [metronidazole], and tindamax [tinidazole]. Medications:   Current Outpatient Medications:    cetirizine (ZYRTEC) 10 MG tablet, Take 1 tablet (10 mg total) by mouth once daily as needed, Disp: , Rfl:    dextroamphetamine-amphetamine (ADDERALL) 30 mg tablet, , Disp: , Rfl:    hydroCHLOROthiazide  (MICROZIDE) 12.5 mg capsule, Take 2 capsules (25 mg total) by mouth once daily Patient reports taking 25mg , Disp: , Rfl:    prenatal vit-iron fum-folic ac (PRENAVITE) tablet, Take 1 tablet by mouth once daily, Disp: , Rfl:    sertraline (ZOLOFT) 100 MG tablet, TAKE 1 & 1 2 (ONE & ONE HALF) TABLETS BY MOUTH ONCE DAILY, Disp: , Rfl: 0   Review of Systems: General:                      No fatigue or weight loss Eyes:                           No vision changes Ears:                            No hearing difficulty Respiratory:                No cough or shortness of breath Pulmonary:                  No asthma or shortness of breath Cardiovascular:           No chest pain, palpitations, dyspnea on exertion Gastrointestinal:          No abdominal bloating, chronic diarrhea,  constipations, masses, pain or hematochezia Genitourinary:             No hematuria, dysuria, abnormal vaginal discharge, pelvic pain,+ Menometrorrhagia Lymphatic:                   No swollen lymph nodes Musculoskeletal:         No muscle weakness Neurologic:                  No extremity weakness, syncope, seizure disorder Psychiatric:                  No history of depression, delusions or suicidal/homicidal ideation      Exam:       Vitals:    08/09/21  1511  BP: 129/82  Pulse: 88      Body mass index is 31.57 kg/m.   WDWN / black female in NAD   Lungs: CTA  CV : RRR without murmur     Neck:  no thyromegaly Abdomen: soft , no mass, normal active bowel sounds,  non-tender, no rebound tenderness Pelvic: tanner stage 5 ,  External genitalia: vulva /labia no lesions Urethra: no prolapse Vagina: normal physiologic d/c Cervix: no lesions, no cervical motion tenderness   Uterus:  Second degree descensus normal size shape and contour, non-tender Adnexa: no mass,  non-tender   Rectovaginal:    Saline  [place]     Ut wnl          Endometrium=10.16 mm   bil ovs wnl No endometrial pathology noted     Impression:    The primary encounter diagnosis was Menorrhagia with irregular cycle. A diagnosis of Uterus descensus was also pertinent to this visit.   Possible Adenomyosis   Plan:  I have spoken with the patient regarding treatment options including expectant management, hormonal options, Lysteda , or surgical intervention including endometrial ablation or TVH. After a full discussion the pt elects to proceed with [facility]  Benefits and risks to surgery: The proposed benefit of the surgery has been discussed with the patient. The possible risks include, but are not limited to: organ injury to the bowel , bladder, ureters, and major blood vessels and nerves. There is a possibility of additional surgeries resulting from these injuries. There is also the risk of blood transfusion and the need to receive blood products during or after the procedure which may rarely lead to HIV or Hepatitis C infection. There is a risk of developing a deep venous thrombosis or a pulmonary embolism . There is the possibility of wound infection and also anesthetic complications, even the rare possibility of death. The patient understands these risks and wishes to proceed. All questions have been answered and the consent has been signed.     No orders of the defined types were placed in this encounter.

## 2021-08-16 ENCOUNTER — Other Ambulatory Visit: Payer: Self-pay

## 2021-08-16 ENCOUNTER — Other Ambulatory Visit
Admission: RE | Admit: 2021-08-16 | Discharge: 2021-08-16 | Disposition: A | Discharge status: Home / Self Care | Payer: Medicaid Other | Source: Ambulatory Visit | Attending: Obstetrics and Gynecology | Admitting: Obstetrics and Gynecology

## 2021-08-16 HISTORY — DX: Pneumonia, unspecified organism: J18.9

## 2021-08-16 NOTE — Patient Instructions (Addendum)
Your procedure is scheduled on: 08/26/21 - Friday Report to the Registration Desk on the 1st floor of the Medical Mall. To find out your arrival time, please call [phone] between 1PM - 3PM on: 08/25/21 - Thursday   REMEMBER: Instructions that are not followed completely may result in serious medical risk, up to and including death; or upon the discretion of your surgeon and anesthesiologist your surgery may need to be rescheduled.  Do not eat food after midnight the night before surgery.  No gum chewing, lozengers or hard candies.  You may however, drink CLEAR liquids up to 2 hours before you are scheduled to arrive for your surgery. Do not drink anything within 2 hours of your scheduled arrival time.  Clear liquids include: - water  - apple juice without pulp - gatorade (not RED, PURPLE, OR BLUE) - black coffee or tea (Do NOT add milk or creamers to the coffee or tea) Do NOT drink anything that is not on this list.  TAKE THESE MEDICATIONS THE MORNING OF SURGERY WITH A SIP OF WATER:  - amLODipine (NORVASC) 5 MG tablet - sertraline (ZOLOFT) 100 MG tablet  One week prior to surgery: Stop Anti-inflammatories (NSAIDS) such as Advil, Aleve, Ibuprofen, Motrin, Naproxen, Naprosyn and Aspirin based products such as Excedrin, Goodys Powder, BC Powder.  Stop ANY OVER THE COUNTER supplements until after surgery.  You may however, continue to take Tylenol if needed for pain up until the day of surgery.  No Alcohol for 24 hours before or after surgery.  No Smoking including e-cigarettes for 24 hours prior to surgery.  No chewable tobacco products for at least 6 hours prior to surgery.  No nicotine patches on the day of surgery.  Do not use any "recreational" drugs for at least a week prior to your surgery.  Please be advised that the combination of cocaine and anesthesia may have negative outcomes, up to and including death. If you test positive for cocaine, your surgery will be  cancelled.  On the morning of surgery brush your teeth with toothpaste and water, you may rinse your mouth with mouthwash if you wish. Do not swallow any toothpaste or mouthwash.  Use CHG Soap or wipes as directed on instruction sheet.  Do not wear jewelry, make-up, hairpins, clips or nail polish.  Do not wear lotions, powders, or perfumes.   Do not shave body from the neck down 48 hours prior to surgery just in case you cut yourself which could leave a site for infection.  Also, freshly shaved skin may become irritated if using the CHG soap.  Contact lenses, hearing aids and dentures may not be worn into surgery.  Do not bring valuables to the hospital. [facility] is not responsible for any missing/lost belongings or valuables.   Notify your doctor if there is any change in your medical condition (cold, fever, infection).  Wear comfortable clothing (specific to your surgery type) to the hospital.  After surgery, you can help prevent lung complications by doing breathing exercises.  Take deep breaths and cough every 1-2 hours. Your doctor may order a device called an Incentive Spirometer to help you take deep breaths. When coughing or sneezing, hold a pillow firmly against your incision with both hands. This is called splinting. Doing this helps protect your incision. It also decreases belly discomfort.  If you are being admitted to the hospital overnight, leave your suitcase in the car. After surgery it may be brought to your room.  If  you are being discharged the day of surgery, you will not be allowed to drive home. You will need a responsible adult (18 years or older) to drive you home and stay with you that night.   If you are taking public transportation, you will need to have a responsible adult (18 years or older) with you. Please confirm with your physician that it is acceptable to use public transportation.   Please call the Pre-admissions Testing Dept. at [phone] if you have any questions about these instructions.  Surgery Visitation Policy:  Patients undergoing a surgery or procedure may have one family member or support person with them as long as that person is not COVID-19 positive or experiencing its symptoms.  That person may remain in the waiting area during the procedure and may rotate out with other people.  Inpatient Visitation:    Visiting hours are 7 a.m. to 8 p.m. Up to two visitors ages 16+ are allowed at one time in a patient room. The visitors may rotate out with other people during the day. Visitors must check out when they leave, or other visitors will not be allowed. One designated support person may remain overnight. The visitor must pass COVID-19 screenings, use hand sanitizer when entering and exiting the patients room and wear a mask at all times, including in the patients room. Patients must also wear a mask when staff or their visitor are in the room. Masking is required regardless of vaccination status.

## 2021-08-17 ENCOUNTER — Other Ambulatory Visit
Admission: RE | Admit: 2021-08-17 | Discharge: 2021-08-17 | Disposition: A | Discharge status: Home / Self Care | Payer: Medicaid Other | Source: Ambulatory Visit | Attending: Obstetrics and Gynecology | Admitting: Obstetrics and Gynecology

## 2021-08-17 ENCOUNTER — Encounter: Payer: Self-pay | Admitting: Urgent Care

## 2021-08-17 DIAGNOSIS — R7989 Other specified abnormal findings of blood chemistry: Secondary | ICD-10-CM | POA: Diagnosis not present

## 2021-08-17 DIAGNOSIS — Z01818 Encounter for other preprocedural examination: Secondary | ICD-10-CM | POA: Insufficient documentation

## 2021-08-17 LAB — BASIC METABOLIC PANEL
Anion gap: 7 (ref 5–15)
BUN: 7 mg/dL (ref 6–20)
CO2: 28 mmol/L (ref 22–32)
Calcium: 9.1 mg/dL (ref 8.9–10.3)
Chloride: 101 mmol/L (ref 98–111)
Creatinine, Ser: 0.72 mg/dL (ref 0.44–1.00)
GFR, Estimated: >60 mL/min (ref >60)
Glucose, Bld: 121 mg/dL — ABNORMAL HIGH (ref 70–99)
Potassium: 2.9 mmol/L — ABNORMAL LOW (ref 3.5–5.1)
Sodium: 136 mmol/L (ref 135–145)

## 2021-08-17 LAB — CBC
HCT: 39.9 % (ref 36.0–46.0)
Hemoglobin: 13 g/dL (ref 12.0–15.0)
MCH: 27 pg (ref 26.0–34.0)
MCHC: 32.6 g/dL (ref 30.0–36.0)
MCV: 82.8 fL (ref 80.0–100.0)
Platelets: 213 10*3/uL (ref 150–400)
RBC: 4.82 MIL/uL (ref 3.87–5.11)
RDW: 13.9 % (ref 11.5–15.5)
WBC: 6.1 10*3/uL (ref 4.0–10.5)
nRBC: 0 % (ref 0.0–0.2)

## 2021-08-17 LAB — TYPE AND SCREEN
ABO/RH(D): O POS
Antibody Screen: NEGATIVE

## 2021-08-17 NOTE — Progress Notes (Signed)
°  [facility] Perioperative Services: Pre-Admission/Anesthesia Testing  Abnormal Lab Notification   Date: 08/17/21  Name: Jeanne Hamilton MRN:   [id number]  Re: Abnormal labs noted during PAT appointment   Notified:  Provider Name Provider Role Notification Mode  Schermerhorn, Maisie Fus, MD OB/GYN Routed and/or faxed via [facility]   Clinical Information and Notes:  ABNORMAL LAB VALUE(S): Lab Results  Component Value Date   K 2.9 (L) 08/17/2021   Jeanne Hamilton is scheduled for an elective VAGINAL HYSTERECTOMY; BILATERAL SALPINGECTOMY on 08/26/2021. In review of her medication reconciliation, it is noted that the patient is taking prescribed diuretic medications (HCTZ 25 mg). Please note, in efforts to promote a safe and effective anesthetic course, per current guidelines/standards set by the [facility] anesthesia team, the minimal acceptable K+ level for the patient to proceed with general anesthesia is 3.0 mmol/L. With that being said, at her current level, patient will require optimization prior to being able to safely undergo the planned procedure. Abnormal result is being forwarded to primary attending surgeon for review and consideration of optimization. Order placed to have K+ rechecked on the day of her procedure to ensure correction of the noted derangement.    This is a Personal assistant; no formal response is required.  Quentin Mulling, MSN, APRN, FNP-C, CEN [facility]  Peri-operative Services Nurse Practitioner Phone: [phone] Fax: [phone] 08/17/21 2:35 PM

## 2021-08-26 ENCOUNTER — Ambulatory Visit
Admission: RE | Admit: 2021-08-26 | Discharge: 2021-08-26 | Disposition: A | Discharge status: Home / Self Care | Payer: Medicaid Other | Attending: Obstetrics and Gynecology | Admitting: Obstetrics and Gynecology

## 2021-08-26 ENCOUNTER — Ambulatory Visit: Payer: Medicaid Other | Admitting: Urgent Care

## 2021-08-26 ENCOUNTER — Other Ambulatory Visit: Payer: Self-pay

## 2021-08-26 ENCOUNTER — Encounter: Payer: Self-pay | Admitting: Obstetrics and Gynecology

## 2021-08-26 ENCOUNTER — Encounter
Admission: RE | Disposition: A | Discharge status: Home / Self Care | Payer: Self-pay | Source: Home / Self Care | Attending: Obstetrics and Gynecology

## 2021-08-26 DIAGNOSIS — N8003 Adenomyosis of the uterus: Secondary | ICD-10-CM | POA: Diagnosis not present

## 2021-08-26 DIAGNOSIS — N920 Excessive and frequent menstruation with regular cycle: Secondary | ICD-10-CM | POA: Insufficient documentation

## 2021-08-26 DIAGNOSIS — I10 Essential (primary) hypertension: Secondary | ICD-10-CM | POA: Insufficient documentation

## 2021-08-26 DIAGNOSIS — Z01818 Encounter for other preprocedural examination: Secondary | ICD-10-CM

## 2021-08-26 DIAGNOSIS — N812 Incomplete uterovaginal prolapse: Secondary | ICD-10-CM | POA: Diagnosis not present

## 2021-08-26 DIAGNOSIS — Z87891 Personal history of nicotine dependence: Secondary | ICD-10-CM | POA: Diagnosis not present

## 2021-08-26 HISTORY — PX: VAGINAL HYSTERECTOMY: SHX2639

## 2021-08-26 HISTORY — PX: BILATERAL SALPINGECTOMY: SHX5743

## 2021-08-26 LAB — POCT I-STAT, CHEM 8
BUN: 9 mg/dL (ref 6–20)
Calcium, Ion: 1.12 mmol/L — ABNORMAL LOW (ref 1.15–1.40)
Chloride: 102 mmol/L (ref 98–111)
Creatinine, Ser: 0.6 mg/dL (ref 0.44–1.00)
Glucose, Bld: 106 mg/dL — ABNORMAL HIGH (ref 70–99)
HCT: 39 % (ref 36.0–46.0)
Hemoglobin: 13.3 g/dL (ref 12.0–15.0)
Potassium: 3.6 mmol/L (ref 3.5–5.1)
Sodium: 138 mmol/L (ref 135–145)
TCO2: 27 mmol/L (ref 22–32)

## 2021-08-26 LAB — POCT PREGNANCY, URINE: Preg Test, Ur: NEGATIVE

## 2021-08-26 SURGERY — HYSTERECTOMY, VAGINAL
Anesthesia: General | Site: Vagina

## 2021-08-26 MED ORDER — CEFAZOLIN SODIUM-DEXTROSE 2-3 GM-%(50ML) IV SOLR
INTRAVENOUS | Status: DC | PRN
  Administered 2021-08-26: 2 g via INTRAVENOUS

## 2021-08-26 MED ORDER — KETAMINE HCL 50 MG/5ML IJ SOSY
PREFILLED_SYRINGE | INTRAMUSCULAR | Status: AC
  Filled 2021-08-26: qty 5

## 2021-08-26 MED ORDER — FAMOTIDINE 20 MG PO TABS
ORAL_TABLET | ORAL | Status: AC
  Administered 2021-08-26: 20 mg via ORAL
  Filled 2021-08-26: qty 1

## 2021-08-26 MED ORDER — CEFAZOLIN SODIUM-DEXTROSE 2-4 GM/100ML-% IV SOLN: 2.0000 g | Freq: Once | INTRAVENOUS | Status: DC

## 2021-08-26 MED ORDER — GABAPENTIN 300 MG PO CAPS
ORAL_CAPSULE | ORAL | Status: AC
  Administered 2021-08-26: 300 mg via ORAL
  Filled 2021-08-26: qty 1

## 2021-08-26 MED ORDER — PROPOFOL 10 MG/ML IV BOLUS
INTRAVENOUS | Status: AC
  Filled 2021-08-26: qty 20

## 2021-08-26 MED ORDER — PROPOFOL 500 MG/50ML IV EMUL
INTRAVENOUS | Status: AC
  Filled 2021-08-26: qty 50

## 2021-08-26 MED ORDER — KETAMINE HCL 10 MG/ML IJ SOLN
INTRAMUSCULAR | Status: DC | PRN
  Administered 2021-08-26: 20 mg via INTRAVENOUS

## 2021-08-26 MED ORDER — ORAL CARE MOUTH RINSE: 15.0000 mL | Freq: Once | OROMUCOSAL | Status: AC

## 2021-08-26 MED ORDER — LIDOCAINE HCL (CARDIAC) PF 100 MG/5ML IV SOSY
PREFILLED_SYRINGE | INTRAVENOUS | Status: DC | PRN
  Administered 2021-08-26: 100 mg via INTRAVENOUS

## 2021-08-26 MED ORDER — ONDANSETRON HCL 4 MG/2ML IJ SOLN
INTRAMUSCULAR | Status: DC | PRN
Start: 2021-08-26 — End: 2021-08-26
  Administered 2021-08-26 (×2): 4 mg via INTRAVENOUS

## 2021-08-26 MED ORDER — ONDANSETRON 4 MG PO TBDP: 4.0000 mg | ORAL_TABLET | Freq: Four times a day (QID) | ORAL | Status: DC | PRN

## 2021-08-26 MED ORDER — ACETAMINOPHEN 500 MG PO TABS
ORAL_TABLET | ORAL | Status: AC
  Administered 2021-08-26: 1000 mg via ORAL
  Filled 2021-08-26: qty 2

## 2021-08-26 MED ORDER — HYDROMORPHONE HCL 1 MG/ML IJ SOLN
INTRAMUSCULAR | Status: AC
  Filled 2021-08-26: qty 1

## 2021-08-26 MED ORDER — 0.9 % SODIUM CHLORIDE (POUR BTL) OPTIME
TOPICAL | Status: DC | PRN
  Administered 2021-08-26: 20 mL

## 2021-08-26 MED ORDER — FENTANYL CITRATE (PF) 100 MCG/2ML IJ SOLN
25.0000 ug | INTRAMUSCULAR | Status: DC | PRN
  Administered 2021-08-26 (×2): 25 ug via INTRAVENOUS

## 2021-08-26 MED ORDER — MIDAZOLAM HCL 2 MG/2ML IJ SOLN
INTRAMUSCULAR | Status: AC
  Filled 2021-08-26: qty 2

## 2021-08-26 MED ORDER — LACTATED RINGERS IV SOLN: INTRAVENOUS | Status: DC

## 2021-08-26 MED ORDER — PROMETHAZINE HCL 25 MG/ML IJ SOLN: 6.2500 mg | INTRAMUSCULAR | Status: DC | PRN

## 2021-08-26 MED ORDER — CHLORHEXIDINE GLUCONATE 0.12 % MT SOLN
OROMUCOSAL | Status: AC
  Administered 2021-08-26: 15 mL via OROMUCOSAL
  Filled 2021-08-26: qty 15

## 2021-08-26 MED ORDER — FENTANYL CITRATE (PF) 100 MCG/2ML IJ SOLN
INTRAMUSCULAR | Status: AC
  Administered 2021-08-26: 25 ug via INTRAVENOUS
  Filled 2021-08-26: qty 2

## 2021-08-26 MED ORDER — PHENYLEPHRINE HCL (PRESSORS) 10 MG/ML IV SOLN
INTRAVENOUS | Status: DC | PRN
  Administered 2021-08-26: 80 ug via INTRAVENOUS

## 2021-08-26 MED ORDER — KETOROLAC TROMETHAMINE 30 MG/ML IJ SOLN
INTRAMUSCULAR | Status: DC | PRN
  Administered 2021-08-26: 30 mg via INTRAVENOUS

## 2021-08-26 MED ORDER — GLYCOPYRROLATE 0.2 MG/ML IJ SOLN
INTRAMUSCULAR | Status: DC | PRN
  Administered 2021-08-26: .2 mg via INTRAVENOUS

## 2021-08-26 MED ORDER — ACETAMINOPHEN 10 MG/ML IV SOLN
INTRAVENOUS | Status: AC
  Filled 2021-08-26: qty 100

## 2021-08-26 MED ORDER — MIDAZOLAM HCL 2 MG/2ML IJ SOLN
INTRAMUSCULAR | Status: DC | PRN
  Administered 2021-08-26: 2 mg via INTRAVENOUS

## 2021-08-26 MED ORDER — SUGAMMADEX SODIUM 500 MG/5ML IV SOLN
INTRAVENOUS | Status: DC | PRN
Start: 2021-08-26 — End: 2021-08-26
  Administered 2021-08-26: 400 mg via INTRAVENOUS

## 2021-08-26 MED ORDER — ROCURONIUM BROMIDE 100 MG/10ML IV SOLN
INTRAVENOUS | Status: DC | PRN
  Administered 2021-08-26: 20 mg via INTRAVENOUS
  Administered 2021-08-26: 50 mg via INTRAVENOUS

## 2021-08-26 MED ORDER — PROPOFOL 10 MG/ML IV BOLUS
INTRAVENOUS | Status: DC | PRN
Start: 2021-08-26 — End: 2021-08-26
  Administered 2021-08-26: 200 mg via INTRAVENOUS

## 2021-08-26 MED ORDER — ACETAMINOPHEN 10 MG/ML IV SOLN: INTRAVENOUS | Status: DC | PRN

## 2021-08-26 MED ORDER — FENTANYL CITRATE (PF) 100 MCG/2ML IJ SOLN
INTRAMUSCULAR | Status: DC | PRN
Start: 2021-08-26 — End: 2021-08-26
  Administered 2021-08-26 (×2): 50 ug via INTRAVENOUS

## 2021-08-26 MED ORDER — LIDOCAINE-EPINEPHRINE 1 %-1:100000 IJ SOLN
INTRAMUSCULAR | Status: AC
  Filled 2021-08-26: qty 1

## 2021-08-26 MED ORDER — POVIDONE-IODINE 10 % EX SWAB
2.0000 "application " | Freq: Once | CUTANEOUS | Status: AC
  Administered 2021-08-26: 2 via TOPICAL

## 2021-08-26 MED ORDER — OXYCODONE-ACETAMINOPHEN 5-325 MG PO TABS
1.0000 | ORAL_TABLET | ORAL | Status: DC | PRN
Start: 2021-08-26 — End: 2021-08-26
  Administered 2021-08-26: 1 via ORAL

## 2021-08-26 MED ORDER — FAMOTIDINE 20 MG PO TABS: 20.0000 mg | ORAL_TABLET | Freq: Once | ORAL | Status: AC

## 2021-08-26 MED ORDER — GABAPENTIN 300 MG PO CAPS: 300.0000 mg | ORAL_CAPSULE | ORAL | Status: AC

## 2021-08-26 MED ORDER — FENTANYL CITRATE (PF) 100 MCG/2ML IJ SOLN
INTRAMUSCULAR | Status: AC
  Filled 2021-08-26: qty 2

## 2021-08-26 MED ORDER — DEXAMETHASONE SODIUM PHOSPHATE 10 MG/ML IJ SOLN
INTRAMUSCULAR | Status: DC | PRN
  Administered 2021-08-26: 10 mg via INTRAVENOUS

## 2021-08-26 MED ORDER — CHLORHEXIDINE GLUCONATE 0.12 % MT SOLN: 15.0000 mL | Freq: Once | OROMUCOSAL | Status: AC

## 2021-08-26 MED ORDER — OXYCODONE-ACETAMINOPHEN 5-325 MG PO TABS
ORAL_TABLET | ORAL | Status: AC
  Filled 2021-08-26: qty 1

## 2021-08-26 MED ORDER — HYDROMORPHONE HCL 1 MG/ML IJ SOLN
INTRAMUSCULAR | Status: DC | PRN
  Administered 2021-08-26: 1 mg via INTRAVENOUS

## 2021-08-26 MED ORDER — LIDOCAINE-EPINEPHRINE 1 %-1:100000 IJ SOLN
INTRAMUSCULAR | Status: DC | PRN
  Administered 2021-08-26: 10 mL

## 2021-08-26 MED ORDER — ACETAMINOPHEN 500 MG PO TABS: 1000.0000 mg | ORAL_TABLET | ORAL | Status: AC

## 2021-08-26 SURGICAL SUPPLY — 42 items
BAG DRN RND TRDRP ANRFLXCHMBR (UROLOGICAL SUPPLIES) ×2
BAG URINE DRAIN 2000ML AR STRL (UROLOGICAL SUPPLIES) ×3 IMPLANT
CATH FOLEY 2WAY  5CC 16FR (CATHETERS) ×1
CATH FOLEY 2WAY 5CC 16FR (CATHETERS) ×2
CATH ROBINSON RED A/P 16FR (CATHETERS) ×3 IMPLANT
CATH URTH 16FR FL 2W BLN LF (CATHETERS) ×2 IMPLANT
COVER SURGICAL LIGHT HANDLE (MISCELLANEOUS) ×1 IMPLANT
DRAPE PERI LITHO V/GYN (MISCELLANEOUS) ×3 IMPLANT
DRAPE SURG 17X11 SM STRL (DRAPES) ×3 IMPLANT
DRAPE UNDER BUTTOCK W/FLU (DRAPES) ×3 IMPLANT
ELECT REM PT RETURN 9FT ADLT (ELECTROSURGICAL) ×3
ELECTRODE REM PT RTRN 9FT ADLT (ELECTROSURGICAL) ×2 IMPLANT
GAUZE 4X4 16PLY ~~LOC~~+RFID DBL (SPONGE) ×6 IMPLANT
GLOVE SURG SYN 8.0 (GLOVE) ×3 IMPLANT
GLOVE SURG SYN 8.0 PF PI (GLOVE) ×2 IMPLANT
GOWN STRL REUS W/ TWL LRG LVL3 (GOWN DISPOSABLE) ×6 IMPLANT
GOWN STRL REUS W/ TWL XL LVL3 (GOWN DISPOSABLE) ×2 IMPLANT
GOWN STRL REUS W/TWL LRG LVL3 (GOWN DISPOSABLE) ×9
GOWN STRL REUS W/TWL XL LVL3 (GOWN DISPOSABLE) ×3
KIT PINK PAD W/HEAD ARE REST (MISCELLANEOUS) ×3
KIT PINK PAD W/HEAD ARM REST (MISCELLANEOUS) ×2 IMPLANT
KIT TURNOVER CYSTO (KITS) ×3 IMPLANT
LABEL OR SOLS (LABEL) ×3 IMPLANT
MANIFOLD NEPTUNE II (INSTRUMENTS) ×3 IMPLANT
NEEDLE HYPO 22GX1.5 SAFETY (NEEDLE) ×3 IMPLANT
NS IRRIG 500ML POUR BTL (IV SOLUTION) ×1 IMPLANT
PACK BASIN MINOR ARMC (MISCELLANEOUS) ×3 IMPLANT
PAD OB MATERNITY 4.3X12.25 (PERSONAL CARE ITEMS) ×3 IMPLANT
PAD PREP 24X41 OB/GYN DISP (PERSONAL CARE ITEMS) ×3 IMPLANT
SCRUB EXIDINE 4% CHG 4OZ (MISCELLANEOUS) ×3 IMPLANT
SOL PREP POV-IOD 4OZ 10% (MISCELLANEOUS) ×1 IMPLANT
SOL PREP PVP 2OZ (MISCELLANEOUS) ×3
SOLUTION PREP PVP 2OZ (MISCELLANEOUS) ×2 IMPLANT
SURGILUBE 2OZ TUBE FLIPTOP (MISCELLANEOUS) ×3 IMPLANT
SUT PDS 2-0 27IN (SUTURE) IMPLANT
SUT VIC AB 0 CT1 27 (SUTURE) ×6
SUT VIC AB 0 CT1 27XCR 8 STRN (SUTURE) ×4 IMPLANT
SUT VIC AB 0 CT1 36 (SUTURE) ×3 IMPLANT
SUT VIC AB 2-0 SH 27 (SUTURE) ×3
SUT VIC AB 2-0 SH 27XBRD (SUTURE) ×2 IMPLANT
SYR 10ML LL (SYRINGE) ×3 IMPLANT
SYR CONTROL 10ML LL (SYRINGE) ×3 IMPLANT

## 2021-08-26 NOTE — Progress Notes (Signed)
Ready for [facility] and bilateral salpingectomy  K= now normal . Other labs appear normal   Neg HCG . Ready to proceed . All questions answered

## 2021-08-26 NOTE — Transfer of Care (Addendum)
Immediate Anesthesia Transfer of Care Note  Patient: Jeanne Hamilton  Procedure(s) Performed: HYSTERECTOMY VAGINAL (Vagina ) BILATERAL SALPINGECTOMY (Bilateral: Vagina )  Patient Location: PACU  Anesthesia Type:General  Level of Consciousness: drowsy and patient cooperative  Airway & Oxygen Therapy: Patient Spontanous Breathing and Patient connected to face mask oxygen  Post-op Assessment: Report given to RN and Post -op Vital signs reviewed and stable  Post vital signs: Reviewed and stable  Last Vitals:  Vitals Value Taken Time  BP 145/83 08/26/21 0922  Temp    Pulse 52 08/26/21 0926  Resp 13 08/26/21 0926  SpO2 100 % 08/26/21 0926  Vitals shown include unvalidated device data.  Last Pain:  Vitals:   08/26/21 0633  TempSrc: Temporal  PainSc: 0-No pain      Patients Stated Pain Goal: 0 (08/26/21 1448)  Complications: No notable events documented.

## 2021-08-26 NOTE — Anesthesia Postprocedure Evaluation (Signed)
Anesthesia Post Note  Patient: Jeanne Hamilton  Procedure(s) Performed: HYSTERECTOMY VAGINAL (Vagina ) BILATERAL SALPINGECTOMY (Bilateral: Vagina )  Patient location during evaluation: PACU Anesthesia Type: General Level of consciousness: awake and alert Pain management: pain level controlled Vital Signs Assessment: post-procedure vital signs reviewed and stable Respiratory status: spontaneous breathing, nonlabored ventilation, respiratory function stable and patient connected to nasal cannula oxygen Cardiovascular status: blood pressure returned to baseline and stable Postop Assessment: no apparent nausea or vomiting Anesthetic complications: no   No notable events documented.   Last Vitals:  Vitals:   08/26/21 1030 08/26/21 1208  BP: (!) 139/98 (!) 148/88  Pulse: 87 65  Resp: 13 17  Temp:  (!) 36.1 C  SpO2: 96% 97%    Last Pain:  Vitals:   08/26/21 1208  TempSrc:   PainSc: Asleep                 Martha Clan

## 2021-08-26 NOTE — Brief Op Note (Signed)
08/26/2021  9:18 AM  PATIENT:  Jeanne Hamilton  41 y.o. female  PRE-OPERATIVE DIAGNOSIS:  menorrhagia, uterine descensus  POST-OPERATIVE DIAGNOSIS:  menorrhagia, uterine descensus  PROCEDURE:  Procedure(s): HYSTERECTOMY VAGINAL (N/A) BILATERAL SALPINGECTOMY (Bilateral)  SURGEON:  Surgeon(s) and Role:    * Nicholis Stepanek, Gwen Her, MD - Primary    * Benjaman Kindler, MD - Assisting  PHYSICIAN ASSISTANT: cst   ASSISTANTS: none   ANESTHESIA:   general  EBL:  75 mL IOF 1000 cc UO 150   BLOOD ADMINISTERED:none  DRAINS: none   LOCAL MEDICATIONS USED:  LIDOCAINE   SPECIMEN:  Source of Specimen:  cervix, uterus and bilateral fallopian tubes   DISPOSITION OF SPECIMEN:  PATHOLOGY  COUNTS:  YES  TOURNIQUET:  * No tourniquets in log *  DICTATION: .Other Dictation: Dictation Number verbal  PLAN OF CARE: Discharge to home after PACU  PATIENT DISPOSITION:  PACU - hemodynamically stable.   Delay start of Pharmacological VTE agent (>24hrs) due to surgical blood loss or risk of bleeding: not applicable

## 2021-08-26 NOTE — Discharge Instructions (Addendum)
AMBULATORY SURGERY  DISCHARGE INSTRUCTIONS   The drugs that you were given will stay in your system until tomorrow so for the next 24 hours you should not:  Drive an automobile Make any legal decisions Drink any alcoholic beverage   You may resume regular meals tomorrow.  Today it is better to start with liquids and gradually work up to solid foods.  You may eat anything you prefer, but it is better to start with liquids, then soup and crackers, and gradually work up to solid foods.   Please notify your doctor immediately if you have any unusual bleeding, trouble breathing, redness and pain at the surgery site, drainage, fever, or pain not relieved by medication.    Additional Instructions:    PER PT, HAS IBUPROFEN, GABAPENTIN, ZOFRAN PRESCRIBED PRIOR TO DAY OF SURGERY.  PER DR. Feliberto Gottron, SCRIPT FOR PERCOCET SENT VIA ESCRIPT TO PT'S PHARMACY TODAY.        Please contact your physician with any problems or Same Day Surgery at [phone], Monday through Friday 6 am to 4 pm, or [place] at [facility] number at [phone].
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
## 2021-08-26 NOTE — Op Note (Signed)
NAMEMANIE, SCOTTO MEDICAL RECORD NO: [id number] ACCOUNT NO: [id number] DATE OF BIRTH: 1981-07-18 FACILITY: [facility] LOCATION: [facility] PHYSICIAN: Boykin Nearing, MD  Operative Report   DATE OF PROCEDURE: 08/26/2021  PREOPERATIVE DIAGNOSES: 1.  Menorrhagia. 2.  Uterine descensus.  POSTOPERATIVE DIAGNOSES: 1.  Menorrhagia. 2.  Uterine descensus.  PROCEDURE: 1.  Total vaginal hysterectomy. 2.  Bilateral salpingectomy.  SURGEON:  Boykin Nearing, MD.  ASSISTANT:  Benjaman Kindler, MD  ANESTHESIA:  General endotracheal anesthesia.  INDICATIONS:  A 41 year old gravida 7, para 4, patient with a long history of menorrhagia, bleeding 14-21 days per month.  The patient is noted to have grade 2 uterine descensus on examination, and she desires definitive surgery.  DESCRIPTION OF PROCEDURE:  After adequate general endotracheal anesthesia, the patient was placed in dorsal supine position with legs in the candy cane stirrups.  The patient's lower abdomen, perineum and vagina were prepped and draped in normal sterile  fashion.  Timeout was performed.  The patient did receive 2 grams of IV Ancef for surgical prophylaxis.  A weighted speculum was placed in the posterior vaginal vault.  The bladder was straight catheterized yielding 125 mL urine.  Cervix was grasped with  2 thyroid tenacula and the cervix was circumferentially injected with 1% lidocaine with 1:100,000 epinephrine.  A direct posterior colpotomy incision was made.  Upon entry into the posterior cul-de-sac. The peritoneum was tagged for later identification  and the uterosacral ligaments were bilaterally clamped, transected, and suture ligated and tagged for later identification.  The anterior cervix was circumferentially incised with the Bovie.  Cardinal ligaments were bilaterally clamped, transected, and  suture ligated with 0 Vicryl suture.  The anterior cul-de-sac was entered sharply and a Deaver retractor placed  within to elevate the bladder anteriorly.  Uterine arteries were then bilaterally clamped, transected and suture ligated with 0 Vicryl suture.   Sequential clamping in the broad ligament to the cornua ensued.  Given the girth of the uterus, the central portion of the uterus was cored out and ultimately the cornua were then bilaterally clamped, transected doubly ligated with 0 Vicryl suture.   Each fallopian tube was identified distal portion was clamped and removed and secured with a 0 Vicryl pedicle suture.  The ovaries appeared normal.  Good hemostasis was noted.  The peritoneum was closed with a 2-0 PDS pursestring suture.  Then, the  vaginal cuff was then closed with a running 0 Vicryl suture with reapproximation of the uterosacral ligaments centrally and the rest of the cuff was closed.  There were no complications.  Good hemostasis was noted at the end of the case. The patient's  bladder was catheterized at the end yielding additional 25 mL urine.  ESTIMATED BLOOD LOSS:  75 mL.  INTRAOPERATIVE FLUIDS:  1000 mL  URINE OUTPUT:  150 mL.  She was taken to recovery in good condition.   PUS D: 08/26/2021 9:54:28 am T: 08/26/2021 2:00:00 pm  JOB: [id number]

## 2021-08-26 NOTE — Anesthesia Procedure Notes (Signed)
Procedure Name: Intubation Date/Time: 08/26/2021 8:09 AM Performed by: Mohammed Kindle, CRNA Pre-anesthesia Checklist: Patient identified, Emergency Drugs available, Suction available and Patient being monitored Patient Re-evaluated:Patient Re-evaluated prior to induction Oxygen Delivery Method: Circle system utilized Preoxygenation: Pre-oxygenation with 100% oxygen Induction Type: IV induction Ventilation: Mask ventilation without difficulty Laryngoscope Size: McGraph and 3 Grade View: Grade I Tube type: Oral Tube size: 6.5 mm Number of attempts: 1 Airway Equipment and Method: Stylet and Oral airway Placement Confirmation: ETT inserted through vocal cords under direct vision, positive ETCO2, breath sounds checked- equal and bilateral and CO2 detector Secured at: 21 cm Tube secured with: Tape Dental Injury: Teeth and Oropharynx as per pre-operative assessment

## 2021-08-27 ENCOUNTER — Emergency Department
Admission: EM | Admit: 2021-08-27 | Discharge: 2021-08-28 | Disposition: A | Discharge status: Home / Self Care | Payer: Medicaid Other | Attending: Emergency Medicine | Admitting: Emergency Medicine

## 2021-08-27 ENCOUNTER — Emergency Department: Payer: Medicaid Other

## 2021-08-27 ENCOUNTER — Other Ambulatory Visit: Payer: Self-pay

## 2021-08-27 ENCOUNTER — Encounter: Payer: Self-pay | Admitting: Obstetrics and Gynecology

## 2021-08-27 DIAGNOSIS — G8918 Other acute postprocedural pain: Secondary | ICD-10-CM | POA: Diagnosis not present

## 2021-08-27 DIAGNOSIS — R102 Pelvic and perineal pain: Secondary | ICD-10-CM | POA: Diagnosis present

## 2021-08-27 LAB — CBC
HCT: 37.3 % (ref 36.0–46.0)
Hemoglobin: 12 g/dL (ref 12.0–15.0)
MCH: 26.7 pg (ref 26.0–34.0)
MCHC: 32.2 g/dL (ref 30.0–36.0)
MCV: 83.1 fL (ref 80.0–100.0)
Platelets: 200 10*3/uL (ref 150–400)
RBC: 4.49 MIL/uL (ref 3.87–5.11)
RDW: 13.6 % (ref 11.5–15.5)
WBC: 11.1 10*3/uL — ABNORMAL HIGH (ref 4.0–10.5)
nRBC: 0 % (ref 0.0–0.2)

## 2021-08-27 LAB — COMPREHENSIVE METABOLIC PANEL
ALT: 16 U/L (ref 0–44)
AST: 33 U/L (ref 15–41)
Albumin: 4 g/dL (ref 3.5–5.0)
Alkaline Phosphatase: 67 U/L (ref 38–126)
Anion gap: 11 (ref 5–15)
BUN: 9 mg/dL (ref 6–20)
CO2: 26 mmol/L (ref 22–32)
Calcium: 8.9 mg/dL (ref 8.9–10.3)
Chloride: 95 mmol/L — ABNORMAL LOW (ref 98–111)
Creatinine, Ser: 0.78 mg/dL (ref 0.44–1.00)
GFR, Estimated: >60 mL/min (ref >60)
Glucose, Bld: 150 mg/dL — ABNORMAL HIGH (ref 70–99)
Potassium: 3.9 mmol/L (ref 3.5–5.1)
Sodium: 132 mmol/L — ABNORMAL LOW (ref 135–145)
Total Bilirubin: 1.2 mg/dL (ref 0.3–1.2)
Total Protein: 7.4 g/dL (ref 6.5–8.1)

## 2021-08-27 LAB — LIPASE, BLOOD: Lipase: 45 U/L (ref 11–51)

## 2021-08-27 MED ORDER — IOHEXOL 350 MG/ML SOLN
100.0000 mL | Freq: Once | INTRAVENOUS | Status: AC | PRN
  Administered 2021-08-27: 100 mL via INTRAVENOUS

## 2021-08-27 MED ORDER — HYDROMORPHONE HCL 2 MG PO TABS: 2.0000 mg | ORAL_TABLET | Freq: Four times a day (QID) | ORAL | 0 refills | Status: AC | PRN

## 2021-08-27 MED ORDER — HYDROMORPHONE HCL 1 MG/ML IJ SOLN
1.0000 mg | Freq: Once | INTRAMUSCULAR | Status: AC
  Administered 2021-08-27: 1 mg via INTRAVENOUS
  Filled 2021-08-27: qty 1

## 2021-08-27 MED ORDER — HYDROMORPHONE HCL 2 MG PO TABS
2.0000 mg | ORAL_TABLET | ORAL | Status: AC
  Administered 2021-08-27: 2 mg via ORAL
  Filled 2021-08-27: qty 1

## 2021-08-27 MED ORDER — ONDANSETRON HCL 4 MG/2ML IJ SOLN: 4.0000 mg | Freq: Once | INTRAMUSCULAR | Status: DC | PRN

## 2021-08-27 MED ORDER — GABAPENTIN 300 MG PO CAPS: 600.0000 mg | ORAL_CAPSULE | Freq: Three times a day (TID) | ORAL | 0 refills | Status: AC

## 2021-08-27 NOTE — ED Triage Notes (Signed)
Pt c/o low abd pain s/p hysterectomy yesterday done here, pt states the pain worsened about 3-4pm today, taking prescribed pain meds with no relief.  Pt is having vaginal bleeding that is equivalent to a period.  Pt in obvious discomfort, pain 6/10 after receiving of fentanyl

## 2021-08-27 NOTE — ED Provider Notes (Signed)
[facility] Provider Note    Event Date/Time   First MD Initiated Contact with Patient 08/27/21 2104     (approximate)   History   Post-op Problem   HPI  Jeanne Hamilton is a 41 y.o. female who had a vaginal hysterectomy yesterday reports persistent pelvic pain which is severe through today.  She is also had vaginal bleeding since then which she describes as similar to menstrual flow.  She does not feel that it is excessive.  It has been constant and not accelerating.  No fever.  No vomiting.  Pain is constant nonradiating.     Physical Exam   Triage Vital Signs: ED Triage Vitals  Enc Vitals Group     BP 08/27/21 2032 120/86     Pulse Rate 08/27/21 2032 81     Resp 08/27/21 2032 16     Temp 08/27/21 2032 98.7 F (37.1 C)     Temp Source 08/27/21 2032 Oral     SpO2 08/27/21 2032 98 %     Weight 08/27/21 2032 228 lb (103.4 kg)     Height 08/27/21 2032 5\' 11"  (1.803 m)     Head Circumference --      Peak Flow --      Pain Score 08/27/21 2049 6     Pain Loc --      Pain Edu? --      Excl. in GC? --     Most recent vital signs: Vitals:   08/27/21 2130 08/27/21 2210  BP: 125/78 124/81  Pulse: 84 80  Resp: 17 17  Temp:    SpO2: 96% 97%     General: Awake, no distress.  CV:  Good peripheral perfusion.  Resp:  Normal effort.  Abd:  No distention.  Soft, diffuse lower abdominal tenderness.  No rigidity or guarding    ED Results / Procedures / Treatments   Labs (all labs ordered are listed, but only abnormal results are displayed) Labs Reviewed  COMPREHENSIVE METABOLIC PANEL - Abnormal; Notable for the following components:      Result Value   Sodium 132 (*)    Chloride 95 (*)    Glucose, Bld 150 (*)    All other components within normal limits  CBC - Abnormal; Notable for the following components:   WBC 11.1 (*)    All other components within normal limits  LIPASE, BLOOD  URINALYSIS, ROUTINE W REFLEX MICROSCOPIC      EKG     RADIOLOGY CT abdomen pelvis viewed and interpreted by me, no obvious abscess, no large pneumoperitoneum.  Radiology report reviewed, showing expected postoperative findings.    PROCEDURES:  Critical Care performed: No  Procedures   MEDICATIONS ORDERED IN ED: Medications  ondansetron (ZOFRAN) injection 4 mg (has no administration in time range)  HYDROmorphone (DILAUDID) tablet 2 mg (has no administration in time range)  HYDROmorphone (DILAUDID) injection 1 mg (1 mg Intravenous Given 08/27/21 2126)  iohexol (OMNIPAQUE) 350 MG/ML injection 100 mL (100 mLs Intravenous Contrast Given 08/27/21 2138)     IMPRESSION / MDM / ASSESSMENT AND PLAN / ED COURSE  I reviewed the triage vital signs and the nursing notes.                              Differential diagnosis includes, but is not limited to, intra-abdominal abscess, bowel injury, bladder injury, functional postoperative pain     Patient presents with  severe pelvic pain after vaginal hysterectomy performed yesterday.  She is nontoxic with normal vital signs.  Will obtain CT scan to assess for complications.  Will give IV Dilaudid 1 mg for pain relief.  ----------------------------------------- 11:38 PM on 08/27/2021 ----------------------------------------- Pain control improved.  CT shows no acute findings.  Will escalate pain management medications at home with refill of gabapentin as she has been taking it (600 mg 3 times daily), and Dilaudid tablets.  Counseled to not take oxycodone and Dilaudid simultaneously.  Advised that if she is not feeling substantially improved by Monday (2 days), call Dr. Francesca Oman office for follow-up visit.      FINAL CLINICAL IMPRESSION(S) / ED DIAGNOSES   Final diagnoses:  Post-operative pain     Rx / DC Orders   ED Discharge Orders          Ordered    HYDROmorphone (DILAUDID) 2 MG tablet  Every 6 hours PRN        08/27/21 2336    gabapentin (NEURONTIN) 300 MG  capsule  3 times daily        08/27/21 2336             Note:  This document was prepared using Dragon voice recognition software and may include unintentional dictation errors.   Sharman Cheek, MD 08/27/21 [phone]

## 2021-08-27 NOTE — ED Triage Notes (Signed)
FIRST NURSE NOTE:  Pt arrived via [facility] from home with reports of low abd pain, hysterectomy done yesterday, no complications.  Pt took oxycodone 3 hours ago, no relief.  Pt given 246mcg of Fentanyl given with EMS 20 G L AC  P-80, 147/87  98% RA  6/10 pain currently.  Hx of HTN.

## 2021-08-29 LAB — SURGICAL PATHOLOGY

## 2022-03-31 ENCOUNTER — Other Ambulatory Visit: Payer: Self-pay | Admitting: Family Medicine

## 2022-03-31 DIAGNOSIS — Z1231 Encounter for screening mammogram for malignant neoplasm of breast: Secondary | ICD-10-CM

## 2022-04-26 ENCOUNTER — Ambulatory Visit
Admission: RE | Admit: 2022-04-26 | Discharge: 2022-04-26 | Disposition: A | Discharge status: Home / Self Care | Payer: Medicaid Other | Source: Ambulatory Visit | Attending: Family Medicine | Admitting: Family Medicine

## 2022-04-26 DIAGNOSIS — Z1231 Encounter for screening mammogram for malignant neoplasm of breast: Secondary | ICD-10-CM | POA: Insufficient documentation

## 2024-04-24 DIAGNOSIS — R030 Elevated blood-pressure reading, without diagnosis of hypertension: Secondary | ICD-10-CM | POA: Insufficient documentation

## 2024-06-25 ENCOUNTER — Emergency Department: Admission: EM | Admit: 2024-06-25 | Discharge: 2024-06-25 | Disposition: A | Discharge status: Home / Self Care

## 2024-06-25 ENCOUNTER — Emergency Department

## 2024-06-25 ENCOUNTER — Other Ambulatory Visit: Payer: Self-pay

## 2024-06-25 DIAGNOSIS — R55 Syncope and collapse: Secondary | ICD-10-CM | POA: Insufficient documentation

## 2024-06-25 DIAGNOSIS — M549 Dorsalgia, unspecified: Secondary | ICD-10-CM | POA: Diagnosis not present

## 2024-06-25 DIAGNOSIS — E876 Hypokalemia: Secondary | ICD-10-CM | POA: Diagnosis not present

## 2024-06-25 DIAGNOSIS — J029 Acute pharyngitis, unspecified: Secondary | ICD-10-CM | POA: Insufficient documentation

## 2024-06-25 DIAGNOSIS — R059 Cough, unspecified: Secondary | ICD-10-CM | POA: Insufficient documentation

## 2024-06-25 DIAGNOSIS — Y901 Blood alcohol level of 20-39 mg/100 ml: Secondary | ICD-10-CM | POA: Insufficient documentation

## 2024-06-25 DIAGNOSIS — R112 Nausea with vomiting, unspecified: Secondary | ICD-10-CM | POA: Insufficient documentation

## 2024-06-25 LAB — URINALYSIS, ROUTINE W REFLEX MICROSCOPIC
Bilirubin Urine: NEGATIVE
Glucose, UA: NEGATIVE mg/dL
Hgb urine dipstick: NEGATIVE
Ketones, ur: NEGATIVE mg/dL
Leukocytes,Ua: NEGATIVE
Nitrite: NEGATIVE
Protein, ur: NEGATIVE mg/dL
Specific Gravity, Urine: 1.011 (ref 1.005–1.030)
pH: 6 (ref 5.0–8.0)

## 2024-06-25 LAB — CBC
HCT: 39.5 % (ref 36.0–46.0)
Hemoglobin: 13 g/dL (ref 12.0–15.0)
MCH: 29.1 pg (ref 26.0–34.0)
MCHC: 32.9 g/dL (ref 30.0–36.0)
MCV: 88.6 fL (ref 80.0–100.0)
Platelets: 190 K/uL (ref 150–400)
RBC: 4.46 MIL/uL (ref 3.87–5.11)
RDW: 12.5 % (ref 11.5–15.5)
WBC: 9.5 K/uL (ref 4.0–10.5)
nRBC: 0 % (ref 0.0–0.2)

## 2024-06-25 LAB — ETHANOL: Alcohol, Ethyl (B): 23 mg/dL — ABNORMAL HIGH (ref <15)

## 2024-06-25 LAB — CBG MONITORING, ED: Glucose-Capillary: 82 mg/dL (ref 70–99)

## 2024-06-25 LAB — COMPREHENSIVE METABOLIC PANEL WITH GFR
ALT: 10 U/L (ref 0–44)
AST: 21 U/L (ref 15–41)
Albumin: 4.2 g/dL (ref 3.5–5.0)
Alkaline Phosphatase: 85 U/L (ref 38–126)
Anion gap: 15 (ref 5–15)
BUN: 8 mg/dL (ref 6–20)
CO2: 22 mmol/L (ref 22–32)
Calcium: 8.9 mg/dL (ref 8.9–10.3)
Chloride: 100 mmol/L (ref 98–111)
Creatinine, Ser: 0.82 mg/dL (ref 0.44–1.00)
GFR, Estimated: >60 mL/min (ref >60)
Glucose, Bld: 93 mg/dL (ref 70–99)
Potassium: 3 mmol/L — ABNORMAL LOW (ref 3.5–5.1)
Sodium: 137 mmol/L (ref 135–145)
Total Bilirubin: 0.4 mg/dL (ref 0.0–1.2)
Total Protein: 7 g/dL (ref 6.5–8.1)

## 2024-06-25 LAB — HCG, QUANTITATIVE, PREGNANCY: hCG, Beta Chain, Quant, S: <1 m[IU]/mL (ref <5)

## 2024-06-25 LAB — D-DIMER, QUANTITATIVE: D-Dimer, Quant: 0.48 ug{FEU}/mL (ref 0.00–0.50)

## 2024-06-25 LAB — TROPONIN T, HIGH SENSITIVITY: Troponin T High Sensitivity: <15 ng/L (ref 0–19)

## 2024-06-25 LAB — LIPASE, BLOOD: Lipase: 23 U/L (ref 11–51)

## 2024-06-25 MED ORDER — SODIUM CHLORIDE 0.9 % IV BOLUS
1000.0000 mL | Freq: Once | INTRAVENOUS | Status: AC
  Administered 2024-06-25: 1000 mL via INTRAVENOUS

## 2024-06-25 MED ORDER — POTASSIUM CHLORIDE CRYS ER 20 MEQ PO TBCR
40.0000 meq | EXTENDED_RELEASE_TABLET | Freq: Once | ORAL | Status: AC
  Administered 2024-06-25: 40 meq via ORAL
  Filled 2024-06-25: qty 2

## 2024-06-25 MED ORDER — ONDANSETRON HCL 4 MG/2ML IJ SOLN
4.0000 mg | Freq: Once | INTRAMUSCULAR | Status: AC
  Administered 2024-06-25: 4 mg via INTRAVENOUS
  Filled 2024-06-25: qty 2

## 2024-06-25 NOTE — Discharge Instructions (Signed)
 You were seen today due to concern of an episode of passing out.  At this time fortunately your labs are reassuring.  I would recommend drinking plenty of water over the next few days as well as getting plenty of rest.  If you have any worsening of symptoms such as chest pain, shortness of breath, abdominal pain, lightheadedness, or any other symptoms you find concerning please return to the emergency department immediately for further medical management.

## 2024-06-25 NOTE — ED Provider Notes (Signed)
 [facility] Provider Note    Event Date/Time   First MD Initiated Contact with Patient 06/25/24 1818     (approximate)   History   Loss of Consciousness   HPI  Jeanne Hamilton is a 43 y.o. female who presents today with concern of a syncopal episode.  Apparently yesterday complaining of some upper back pain, no affiliated shortness of breath with this, lasted for a few hours and then resolved.  She started having some symptoms of a sore throat and a cough.  Today she took some NyQuil and DayQuil, apparently questionable alcohol use as well.  She got up and was feeling very lightheaded, apparently had a syncopal episode, was sitting down and fell over but did not actually pass out.  She denies any chance of pregnancy, she has a history of high blood pressure but no other medical history denies any recent surgeries no prior history of PEs or DVTs.  Per EMS, and route she became very hypotensive and had a near syncopal episode again.  Patient saying that now she does feel little bit better but continues to have off-and-on nausea, 1 episode of vomiting enroute.     Physical Exam   Triage Vital Signs: ED Triage Vitals  Encounter Vitals Group     BP      Girls Systolic BP Percentile      Girls Diastolic BP Percentile      Boys Systolic BP Percentile      Boys Diastolic BP Percentile      Pulse      Resp      Temp      Temp src      SpO2      Weight      Height      Head Circumference      Peak Flow      Pain Score      Pain Loc      Pain Education      Exclude from Growth Chart     Most recent vital signs: Vitals:   06/25/24 1821  BP: 112/83  Pulse: 71  Resp: 13  Temp: 98.5 F (36.9 C)  SpO2: 100%     General: Awake, appears to be fatigued on exam CV:  Good peripheral perfusion.  Regular rate and rhythm, no appreciated extremity swelling Resp:  Normal effort.  Able to speak full sentences Abd:  No distention.  Soft non  tender Other:     ED Results / Procedures / Treatments   Labs (all labs ordered are listed, but only abnormal results are displayed) Labs Reviewed  COMPREHENSIVE METABOLIC PANEL WITH GFR - Abnormal; Notable for the following components:      Result Value   Potassium 3.0 (*)    All other components within normal limits  URINALYSIS, ROUTINE W REFLEX MICROSCOPIC - Abnormal; Notable for the following components:   Color, Urine YELLOW (*)    APPearance CLEAR (*)    All other components within normal limits  ETHANOL - Abnormal; Notable for the following components:   Alcohol, Ethyl (B) 23 (*)    All other components within normal limits  CBC  HCG, QUANTITATIVE, PREGNANCY  LIPASE, BLOOD  D-DIMER, QUANTITATIVE  CBG MONITORING, ED  TROPONIN T, HIGH SENSITIVITY     EKG  Sinus rhythm with rate of about 65, axis of 50, intervals appear to be within normal limits, no obvious ischemia that I appreciate on this EKG   RADIOLOGY No  acute cardiopulmonary findings  PROCEDURES:  Critical Care performed: No  Procedures   MEDICATIONS ORDERED IN ED: Medications  potassium chloride  SA (KLOR-CON  M) CR tablet 40 mEq (has no administration in time range)  ondansetron  (ZOFRAN ) injection 4 mg (4 mg Intravenous Given 06/25/24 1825)  sodium chloride  0.9 % bolus 1,000 mL (0 mLs Intravenous Stopped 06/25/24 1935)     IMPRESSION / MDM / ASSESSMENT AND PLAN / ED COURSE  I reviewed the triage vital signs and the nursing notes.                               Patient's presentation is most consistent with acute complicated illness / injury requiring diagnostic workup.  43 year old female who presents today with concern of a syncopal episode.  Apparently and route by EMS had recurrent near syncopal episodes and was hypotensive.  Currently no focal symptoms, does continue to complain of some symptoms of lightheadedness.  Not sure what to make of her symptoms of back pain yesterday or her sore  throat, not seeing any focal signs of an infection here, and overall suspicion for PE is relatively low, also feel unlikely ACS.  She does not have any active pain at this time.  I am obtaining labs and imaging, will attempt symptomatic management I suspect she likely had a vagal episode but are not clear secondary to what possibly underlying gastroenteritis from some recent meal.  Will follow-up labs and determine further workup and disposition accordingly.   Clinical Course as of 06/25/24 2021  Wed Jun 25, 2024  2018 Patient symptoms are significantly improved, she is comfortable with discharge home at this time.  She was able to ambulate without much restriction.  Will have her discharged home I discussed return precautions, she agrees with the plan. [SK]    Clinical Course User Index [SK] Fernand Rossie HERO, MD     FINAL CLINICAL IMPRESSION(S) / ED DIAGNOSES   Final diagnoses:  Vasovagal syncope  Hypokalemia     Rx / DC Orders   ED Discharge Orders     None        Note:  This document was prepared using Dragon voice recognition software and may include unintentional dictation errors.   Fernand Rossie HERO, MD 06/25/24 2021

## 2024-06-25 NOTE — ED Triage Notes (Addendum)
 Pt arrives via [facility] from home after a syncopal episode lasting 5 mins per husband who witnessed event. Per EMS pt has been feeling sick with a sore throat recently and is afebrile. Pt has a hx of HTN. Per EMS pt's VSS but BP dropped en route to 84/36. Pt vomited on scene and per EMS husband reported pt having a blank stare after their syncopal episode. Per EMS, pt reported having 3 shots of ETOH earlier today. Pt is A&Ox4 during triage.

## 2024-07-01 ENCOUNTER — Inpatient Hospital Stay
Admission: RE | Admit: 2024-07-01 | Discharge: 2024-07-01 | Attending: Emergency Medicine | Admitting: Emergency Medicine

## 2024-07-01 VITALS — BP 128/95 | HR 75 | Temp 98.6°F | Resp 18 | Wt 205.6 lb

## 2024-07-01 DIAGNOSIS — O0993 Supervision of high risk pregnancy, unspecified, third trimester: Secondary | ICD-10-CM | POA: Insufficient documentation

## 2024-07-01 DIAGNOSIS — J029 Acute pharyngitis, unspecified: Secondary | ICD-10-CM | POA: Diagnosis not present

## 2024-07-01 DIAGNOSIS — N921 Excessive and frequent menstruation with irregular cycle: Secondary | ICD-10-CM | POA: Insufficient documentation

## 2024-07-01 DIAGNOSIS — H6502 Acute serous otitis media, left ear: Secondary | ICD-10-CM | POA: Diagnosis not present

## 2024-07-01 LAB — POCT RAPID STREP A (OFFICE): Rapid Strep A Screen: NEGATIVE

## 2024-07-01 MED ORDER — IBUPROFEN 600 MG PO TABS: 600.0000 mg | ORAL_TABLET | Freq: Four times a day (QID) | ORAL | 0 refills | Status: AC | PRN

## 2024-07-01 MED ORDER — FLUTICASONE PROPIONATE 50 MCG/ACT NA SUSP: 2.0000 | Freq: Every day | NASAL | 0 refills | Status: AC

## 2024-07-01 MED ORDER — PROMETHAZINE-DM 6.25-15 MG/5ML PO SYRP: 5.0000 mL | ORAL_SOLUTION | Freq: Four times a day (QID) | ORAL | 0 refills | Status: AC | PRN

## 2024-07-01 MED ORDER — PREDNISONE 20 MG PO TABS: 40.0000 mg | ORAL_TABLET | Freq: Every day | ORAL | 0 refills | Status: AC
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
## 2024-07-01 NOTE — Discharge Instructions (Addendum)
 Your rapid strep was negative today.  We have sent it off for culture to confirm absence of strep throat.  If it is positive, we will contact you and call in the appropriate antibiotics.  1 gram of Tylenol  and 600 mg ibuprofen  together 3-4 times a day as needed for pain.  Make sure you drink plenty of extra fluids.  Some people find salt water gargles and  Traditional Medicinal's Throat Coat tea helpful. Take 5 mL of liquid Benadryl  and 5 mL of Maalox/Mylanta. Mix it together, and then hold it in your mouth for as long as you can and then swallow. You may do this 4 times a day.  Honey and lemon dissolved in hot water can also be soothing.  Flonase, prednisone, regular Mucinex, saline nasal irrigation with a NeilMed rinse and distilled water as often as you want for the eustachian tube dysfunction.  Promethazine  DM for cough.  Go to www.goodrx.com  or www.costplusdrugs.com to look up your medications. This will give you a list of where you can find your prescriptions at the most affordable prices. Or ask the pharmacist what the cash price is, or if they have any other discount programs available to help make your medication more affordable. This can be less expensive than what you would pay with insurance.

## 2024-07-01 NOTE — ED Triage Notes (Signed)
 Pt c/o L ear pain & sore throat x3 days. Has tried OTC meds w/o relief.

## 2024-07-03 ENCOUNTER — Ambulatory Visit (HOSPITAL_COMMUNITY): Payer: Self-pay

## 2024-07-03 LAB — CULTURE, GROUP A STREP (THRC)
